# Patient Record
Sex: Male | Born: 1965 | Race: Asian | Hispanic: No | Marital: Married | Smoking: Never smoker
Health system: Southern US, Community
[De-identification: ages and names within clinical notes are randomized; demographics above are authoritative.]

## PROBLEM LIST (undated history)

## (undated) DIAGNOSIS — E785 Hyperlipidemia, unspecified: Secondary | ICD-10-CM

## (undated) DIAGNOSIS — I639 Cerebral infarction, unspecified: Secondary | ICD-10-CM

## (undated) HISTORY — PX: LOOP RECORDER IMPLANT: SHX5954

## (undated) HISTORY — PX: NO PAST SURGERIES: SHX2092

## (undated) HISTORY — DX: Cerebral infarction, unspecified: I63.9

---

## 2014-07-13 ENCOUNTER — Observation Stay (HOSPITAL_COMMUNITY): Payer: BC Managed Care – PPO

## 2014-07-13 ENCOUNTER — Inpatient Hospital Stay (HOSPITAL_COMMUNITY)
Admission: EM | Admit: 2014-07-13 | Discharge: 2014-07-15 | DRG: 066 | Disposition: A | Discharge status: Home / Self Care | Payer: BC Managed Care – PPO | Attending: Internal Medicine | Admitting: Internal Medicine

## 2014-07-13 ENCOUNTER — Encounter (HOSPITAL_COMMUNITY): Payer: Self-pay | Admitting: Neurology

## 2014-07-13 ENCOUNTER — Emergency Department (HOSPITAL_COMMUNITY): Payer: BC Managed Care – PPO

## 2014-07-13 DIAGNOSIS — R030 Elevated blood-pressure reading, without diagnosis of hypertension: Secondary | ICD-10-CM | POA: Diagnosis not present

## 2014-07-13 DIAGNOSIS — I63411 Cerebral infarction due to embolism of right middle cerebral artery: Secondary | ICD-10-CM | POA: Diagnosis not present

## 2014-07-13 DIAGNOSIS — E785 Hyperlipidemia, unspecified: Secondary | ICD-10-CM | POA: Diagnosis not present

## 2014-07-13 DIAGNOSIS — Z823 Family history of stroke: Secondary | ICD-10-CM

## 2014-07-13 DIAGNOSIS — G459 Transient cerebral ischemic attack, unspecified: Secondary | ICD-10-CM

## 2014-07-13 DIAGNOSIS — IMO0001 Reserved for inherently not codable concepts without codable children: Secondary | ICD-10-CM | POA: Diagnosis present

## 2014-07-13 DIAGNOSIS — R4781 Slurred speech: Secondary | ICD-10-CM | POA: Diagnosis present

## 2014-07-13 DIAGNOSIS — R531 Weakness: Secondary | ICD-10-CM | POA: Diagnosis present

## 2014-07-13 DIAGNOSIS — I639 Cerebral infarction, unspecified: Secondary | ICD-10-CM | POA: Diagnosis present

## 2014-07-13 DIAGNOSIS — R29810 Facial weakness: Secondary | ICD-10-CM | POA: Diagnosis present

## 2014-07-13 LAB — CBC
HCT: 46.9 % (ref 39.0–52.0)
Hemoglobin: 15.9 g/dL (ref 13.0–17.0)
MCH: 31.4 pg (ref 26.0–34.0)
MCHC: 33.9 g/dL (ref 30.0–36.0)
MCV: 92.5 fL (ref 78.0–100.0)
Platelets: 190 10*3/uL (ref 150–400)
RBC: 5.07 MIL/uL (ref 4.22–5.81)
RDW: 13.1 % (ref 11.5–15.5)
WBC: 6.3 10*3/uL (ref 4.0–10.5)

## 2014-07-13 LAB — APTT: aPTT: 32 seconds (ref 24–37)

## 2014-07-13 LAB — CBG MONITORING, ED: GLUCOSE-CAPILLARY: 133 mg/dL — AB (ref 65–99)

## 2014-07-13 LAB — COMPREHENSIVE METABOLIC PANEL
ALBUMIN: 3.9 g/dL (ref 3.5–5.0)
ALK PHOS: 74 U/L (ref 38–126)
ALT: 22 U/L (ref 17–63)
ANION GAP: 6 (ref 5–15)
AST: 20 U/L (ref 15–41)
BUN: 17 mg/dL (ref 6–20)
CALCIUM: 9.2 mg/dL (ref 8.9–10.3)
CO2: 28 mmol/L (ref 22–32)
CREATININE: 0.89 mg/dL (ref 0.61–1.24)
Chloride: 107 mmol/L (ref 101–111)
GFR calc non Af Amer: >60 mL/min (ref >60)
GLUCOSE: 141 mg/dL — AB (ref 65–99)
POTASSIUM: 4.1 mmol/L (ref 3.5–5.1)
SODIUM: 141 mmol/L (ref 135–145)
TOTAL PROTEIN: 6.3 g/dL — AB (ref 6.5–8.1)
Total Bilirubin: 0.7 mg/dL (ref 0.3–1.2)

## 2014-07-13 LAB — I-STAT CHEM 8, ED
BUN: 20 mg/dL (ref 6–20)
CHLORIDE: 103 mmol/L (ref 101–111)
CREATININE: 1 mg/dL (ref 0.61–1.24)
Calcium, Ion: 1.2 mmol/L (ref 1.12–1.23)
Glucose, Bld: 139 mg/dL — ABNORMAL HIGH (ref 65–99)
HCT: 48 % (ref 39.0–52.0)
Hemoglobin: 16.3 g/dL (ref 13.0–17.0)
Potassium: 4 mmol/L (ref 3.5–5.1)
SODIUM: 141 mmol/L (ref 135–145)
TCO2: 26 mmol/L (ref 0–100)

## 2014-07-13 LAB — RAPID URINE DRUG SCREEN, HOSP PERFORMED
Amphetamines: NOT DETECTED
Barbiturates: NOT DETECTED
Benzodiazepines: NOT DETECTED
COCAINE: NOT DETECTED
OPIATES: NOT DETECTED
TETRAHYDROCANNABINOL: NOT DETECTED

## 2014-07-13 LAB — DIFFERENTIAL
BASOS ABS: 0 10*3/uL (ref 0.0–0.1)
Basophils Relative: 0 % (ref 0–1)
Eosinophils Absolute: 0.1 10*3/uL (ref 0.0–0.7)
Eosinophils Relative: 1 % (ref 0–5)
Lymphocytes Relative: 24 % (ref 12–46)
Lymphs Abs: 1.5 10*3/uL (ref 0.7–4.0)
Monocytes Absolute: 0.4 10*3/uL (ref 0.1–1.0)
Monocytes Relative: 6 % (ref 3–12)
Neutro Abs: 4.3 10*3/uL (ref 1.7–7.7)
Neutrophils Relative %: 69 % (ref 43–77)

## 2014-07-13 LAB — PROTIME-INR
INR: 0.94 (ref 0.00–1.49)
Prothrombin Time: 12.8 seconds (ref 11.6–15.2)

## 2014-07-13 LAB — GLUCOSE, CAPILLARY: Glucose-Capillary: 95 mg/dL (ref 65–99)

## 2014-07-13 LAB — I-STAT TROPONIN, ED: Troponin i, poc: 0 ng/mL (ref 0.00–0.08)

## 2014-07-13 MED ORDER — ASPIRIN 81 MG PO CHEW
324.0000 mg | CHEWABLE_TABLET | Freq: Once | ORAL | Status: AC
  Administered 2014-07-13: 324 mg via ORAL
  Filled 2014-07-13: qty 4

## 2014-07-13 MED ORDER — ONDANSETRON HCL 4 MG/2ML IJ SOLN: 4.0000 mg | Freq: Three times a day (TID) | INTRAMUSCULAR | Status: AC | PRN

## 2014-07-13 MED ORDER — SODIUM CHLORIDE 0.9 % IV SOLN
INTRAVENOUS | Status: DC
  Administered 2014-07-13: 18:00:00 via INTRAVENOUS

## 2014-07-13 MED ORDER — ACETAMINOPHEN 325 MG PO TABS: 650.0000 mg | ORAL_TABLET | ORAL | Status: DC | PRN

## 2014-07-13 MED ORDER — STROKE: EARLY STAGES OF RECOVERY BOOK
Freq: Once | Status: AC
  Administered 2014-07-13: 1

## 2014-07-13 MED ORDER — ASPIRIN 325 MG PO TABS
325.0000 mg | ORAL_TABLET | Freq: Every day | ORAL | Status: DC
  Administered 2014-07-14 – 2014-07-15 (×2): 325 mg via ORAL
  Filled 2014-07-13 (×2): qty 1

## 2014-07-13 MED ORDER — ENOXAPARIN SODIUM 40 MG/0.4ML ~~LOC~~ SOLN
40.0000 mg | SUBCUTANEOUS | Status: DC
  Administered 2014-07-13 – 2014-07-14 (×2): 40 mg via SUBCUTANEOUS
  Filled 2014-07-13 (×2): qty 0.4

## 2014-07-13 NOTE — H&P (Signed)
History and Physical        Hospital Admission Note Date: 07/13/2014  Patient name: Ryan Mitchell Medical record number: [id number] Date of birth: Mar 18, 1965 Age: 49 y.o. Gender: male  PCP: Leanor RubensteinSUN,VYVYAN Y, MD  Referring physician: Wynetta EmeryNicole Pisciotta, PA     Chief Complaint:  Right-sided facial droop with slurred speech and right lower extremity weakness  HPI: Patient is a 49 year old male with hyperlipidemia not on any statins, presented to ED with evaluation for above symptoms. Patient reported that he was at work today and took a nap after lunch. Around 1:40 PM, he noticed that he had difficulty speaking and right leg weakness. He spoke to his colleague on the phone who also noted that he had slurred speech. His wife who works in the same building came to evaluate him and noted that he had right facial drooping. Patient also reported having headache, right lower extremity weakness. EMS was called and by the time EMS arrived, his symptoms had resolved, lasted about 20 minutes. Patient aborted his father had stroke at the age of 48.  Ed workup showed unremarkable CBC, BMET, CT head negative for acute CVA.  Review of Systems:  Constitutional: Denies fever, chills, diaphoresis, poor appetite and fatigue.  HEENT: Denies photophobia, eye pain, redness, hearing loss, ear pain, congestion, sore throat, rhinorrhea, sneezing, mouth sores, trouble swallowing, neck pain, neck stiffness and tinnitus.   Respiratory: Denies SOB, DOE, cough, chest tightness,  and wheezing.   Cardiovascular: Denies chest pain, palpitations and leg swelling.  Gastrointestinal: Denies nausea, vomiting, abdominal pain, diarrhea, constipation, blood in stool and abdominal distention.  Genitourinary: Denies dysuria, urgency, frequency, hematuria, flank pain and difficulty urinating.  Musculoskeletal: Denies myalgias, back pain,  joint swelling, arthralgias and gait problem.  Skin: Denies pallor, rash and wound.  Neurological: Please see history of present illness Hematological: Denies adenopathy. Easy bruising, personal or family bleeding history  Psychiatric/Behavioral: Denies suicidal ideation, mood changes, confusion, nervousness, sleep disturbance and agitation  Past Medical History: Hyperlipidemia  History reviewed. No pertinent past surgical history.  Medications: Prior to Admission medications   Not on File    Allergies:  No Known Allergies  Social History:  reports that he has never smoked. He does not have any smokeless tobacco history on file. He reports that he drinks alcohol 1 beer or glass of wine at night. Denies any drug use, lives at home with his wife  Family History: Patient reports that his father had a stroke at the age of 48  Physical Exam: Blood pressure 132/86, pulse 59, temperature 98.2 F (36.8 C), temperature source Oral, resp. rate 16, height 5' (1.524 m), weight 53.978 kg (119 lb), SpO2 99 %. General: Alert, awake, oriented x3, in no acute distress, no dysarthria or facial drooping. HEENT: normocephalic, atraumatic, anicteric sclera, pink conjunctiva, pupils equal and reactive to light and accomodation, oropharynx clear Neck: supple, no masses or lymphadenopathy, no goiter, no bruits  Heart: Regular rate and rhythm, without murmurs, rubs or gallops. Lungs: Clear to auscultation bilaterally, no wheezing, rales or rhonchi. Abdomen: Soft, nontender, nondistended, positive bowel sounds, no masses. Extremities: No clubbing, cyanosis or edema with positive pedal pulses. Neuro: Grossly intact, no  focal neurological deficits, strength 5/5 upper and lower extremities bilaterally Psych: alert and oriented x 3, normal mood and affect Skin: no rashes or lesions, warm and dry   LABS on Admission:  Basic Metabolic Panel:  Recent Labs Lab 07/13/14 1454 07/13/14 1516  NA 141 141  K  4.1 4.0  CL 107 103  CO2 28  --   GLUCOSE 141* 139*  BUN 17 20  CREATININE 0.89 1.00  CALCIUM 9.2  --    Liver Function Tests:  Recent Labs Lab 07/13/14 1454  AST 20  ALT 22  ALKPHOS 74  BILITOT 0.7  PROT 6.3*  ALBUMIN 3.9   No results for input(s): LIPASE, AMYLASE in the last 168 hours. No results for input(s): AMMONIA in the last 168 hours. CBC:  Recent Labs Lab 07/13/14 1454 07/13/14 1516  WBC 6.3  --   NEUTROABS 4.3  --   HGB 15.9 16.3  HCT 46.9 48.0  MCV 92.5  --   PLT 190  --    Cardiac Enzymes: No results for input(s): CKTOTAL, CKMB, CKMBINDEX, TROPONINI in the last 168 hours. BNP: Invalid input(s): POCBNP CBG:  Recent Labs Lab 07/13/14 1527  GLUCAP 133*    Radiological Exams on Admission:  Ct Head (brain) Wo Contrast  07/13/2014   CLINICAL DATA:  Right-sided facial droop and slurred speech, transient  EXAM: CT HEAD WITHOUT CONTRAST  TECHNIQUE: Contiguous axial images were obtained from the base of the skull through the vertex without intravenous contrast.  COMPARISON:  None.  FINDINGS: The ventricles are normal in size and configuration. There is no intracranial mass, hemorrhage, extra-axial fluid collection, or midline shift. Gray-white compartments are normal. There is no demonstrable acute infarct. The bony calvarium appears intact. The mastoid air cells are clear. There is rightward deviation of the nasal septum.  IMPRESSION: No intracranial mass, hemorrhage, or focal gray - white compartment lesions/acute appearing infarct. There is rightward deviation of the nasal septum.   Electronically Signed   By: Bretta Bang III M.D.   On: 07/13/2014 15:35    *I have personally reviewed the images above*  EKG: Independently reviewed. Rate 64, normal sinus rhythm, no acute ST-T wave changes just above ischemia   Assessment/Plan Principal Problem:   TIA (transient ischemic attack): Patient had symptoms of right facial drooping, slurred speech, right  lower exhibiting weakness which has resolved - Admit to telemetry - place on neuro checks q4 hr - Head CT on admission negative for acute event. Ordered MRI brain, MRA head. - obtain 2D echo, carotid doppler, A1C and lipid panel - swallow eval, PT, OT. - ASA 325 mg/300mg  PR daily. Allow permissive HTN  Active Problems:   Hyperlipidemia - Obtain lipid panel, patient may need to be on statin    Elevated BP - Allow permissive hypertension, BP 123/102. Patient is not on any antihypertensives outpatient.  DVT prophylaxis: Lovenox  CODE STATUS: full code  Family Communication: Admission, patients condition and plan of care including tests being ordered have been discussed with the patient and wife who indicates understanding and agree with the plan and Code Status  Disposition plan: Further plan will depend as patient's clinical course evolves and further radiologic and laboratory data become available.   Time Spent on Admission: 1 hour  RAI,RIPUDEEP M.D. [facility] 07/13/2014, 4:33 PM Pager: [phone]  If 7PM-7AM, please contact night-coverage [email or web address] Password [email or web address]

## 2014-07-13 NOTE — ED Notes (Signed)
Pt reports today at 1330 after lunch was trying to take a nap, coworker noticed right sided facial droop, speech was slurred. EMS called and at that time they arrived, symptoms were gone. intial BP 182/122, after 5 mins 150/112, CBG 130. At current denies pain, is a x 4, no s/s.

## 2014-07-13 NOTE — ED Provider Notes (Signed)
CSN: [id number]     Arrival date & time 07/13/14  1437 History   None    Chief Complaint  Patient presents with  . Transient Ischemic Attack     (Consider location/radiation/quality/duration/timing/severity/associated sxs/prior Treatment) HPI  Blood pressure 155/86, pulse 64, temperature 97.4 F (36.3 C), temperature source Oral, resp. rate 18, height 5' (1.524 m), weight 119 lb (53.978 kg), SpO2 98 %.  Ryan Mitchell is a 49 y.o. male .in for evaluation of right-sided facial droop, slurred speech and right lower extremity weakness onset this afternoon at approximately 2 PM. Patient states that he went to lunch, he felt fatigued her words and tried to take a nap by pushing 2 chairs together, he states that he lay down on the right side of his face. He states that he couldn't sleep and he woke up, he spoke to his supervisor on the phone who states that he had slurred speech, he called his wife who came to evaluate him in she noted that he had a right sided facial droop. He reports that he had a resolved headache and date right lower extremity pain and weakness but he could walk and there was no falling. By the time EMS was called all of the symptoms have resolved. States that the symptoms lasted approximately 5-10 minutes. He denies any history of tobacco use, diabetes, hypertension. He does have a recently diagnosed hyperlipidemia but was not started on any medications. He saw his primary care physician in approximately 2 months ago and checks and regularly. Father stroke at 3870   History reviewed. No pertinent past medical history. History reviewed. No pertinent past surgical history. No family history on file. History  Substance Use Topics  . Smoking status: Never Smoker   . Smokeless tobacco: Not on file  . Alcohol Use: Yes    Review of Systems  10 systems reviewed and found to be negative, except as noted in the HPI.  Allergies  Review of patient's allergies indicates no known  allergies.  Home Medications   Prior to Admission medications   Not on File   BP 155/86 mmHg  Pulse 64  Temp(Src) 97.4 F (36.3 C) (Oral)  Resp 18  Ht 5' (1.524 m)  Wt 119 lb (53.978 kg)  BMI 23.24 kg/m2  SpO2 98% Physical Exam  Constitutional: He is oriented to person, place, and time. He appears well-developed and well-nourished.  HENT:  Head: Normocephalic and atraumatic.  Mouth/Throat: Oropharynx is clear and moist.  Eyes: Conjunctivae and EOM are normal. Pupils are equal, round, and reactive to light.  No TTP of maxillary or frontal sinuses  No TTP or induration of temporal arteries bilaterally  Neck: Normal range of motion. Neck supple.  FROM to C-spine. Pt can touch chin to chest without discomfort. No TTP of midline cervical spine.   Cardiovascular: Normal rate, regular rhythm and intact distal pulses.   Pulmonary/Chest: Effort normal and breath sounds normal. No respiratory distress. He has no wheezes. He has no rales. He exhibits no tenderness.  Abdominal: Soft. Bowel sounds are normal. There is no tenderness.  Musculoskeletal: Normal range of motion. He exhibits no edema or tenderness.  Neurological: He is alert and oriented to person, place, and time. No cranial nerve deficit.  II-Visual fields grossly intact. III/IV/VI-Extraocular movements intact.  Pupils reactive bilaterally. V/VII-Smile symmetric, equal eyebrow raise,  facial sensation intact VIII- Hearing grossly intact IX/X-Normal gag XI-bilateral shoulder shrug XII-midline tongue extension Motor: 5/5 bilaterally with normal tone and bulk Cerebellar: Normal  finger-to-nose  and normal heel-to-shin test.   Romberg negative Ambulates with a coordinated gait   Nursing note and vitals reviewed.   ED Course  Procedures (including critical care time) Labs Review Labs Reviewed  COMPREHENSIVE METABOLIC PANEL - Abnormal; Notable for the following:    Glucose, Bld 141 (*)    Total Protein 6.3 (*)    All  other components within normal limits  I-STAT CHEM 8, ED - Abnormal; Notable for the following:    Glucose, Bld 139 (*)    All other components within normal limits  CBG MONITORING, ED - Abnormal; Notable for the following:    Glucose-Capillary 133 (*)    All other components within normal limits  PROTIME-INR  APTT  CBC  DIFFERENTIAL  I-STAT TROPOININ, ED    Imaging Review Ct Head (brain) Wo Contrast  07/13/2014   CLINICAL DATA:  Right-sided facial droop and slurred speech, transient  EXAM: CT HEAD WITHOUT CONTRAST  TECHNIQUE: Contiguous axial images were obtained from the base of the skull through the vertex without intravenous contrast.  COMPARISON:  None.  FINDINGS: The ventricles are normal in size and configuration. There is no intracranial mass, hemorrhage, extra-axial fluid collection, or midline shift. Gray-white compartments are normal. There is no demonstrable acute infarct. The bony calvarium appears intact. The mastoid air cells are clear. There is rightward deviation of the nasal septum.  IMPRESSION: No intracranial mass, hemorrhage, or focal gray - white compartment lesions/acute appearing infarct. There is rightward deviation of the nasal septum.   Electronically Signed   By: Bretta Bang III M.D.   On: 07/13/2014 15:35     EKG Interpretation None      MDM   Final diagnoses:  Transient cerebral ischemia, unspecified transient cerebral ischemia type   Filed Vitals:   07/13/14 1447 07/13/14 1523 07/13/14 1533  BP: 155/86 137/89   Pulse: 64 65   Temp: 97.4 F (36.3 C)  98.2 F (36.8 C)  TempSrc: Oral    Resp: 18 18   Height: 5' (1.524 m)    Weight: 119 lb (53.978 kg)    SpO2: 98% 97%     Medications  aspirin chewable tablet 324 mg (not administered)    Ryan Mitchell is a pleasant 49 y.o. male presenting with slurred speech, right-sided facial droop and right lower extremity pain and weakness with no falling or ataxia. The symptoms lasted for 5-10 minutes and  resolved spontaneously. Father had a stroke in his 65s and patient has recently been diagnosed with hyperlipidemia otherwise no vascular risk factors. CT head and blood work are negative. EKG normal sinus rhythm. Patient is started on full dose aspirin. Case discussed with [facility] Dr. Isidoro Donning who accepts admission.    Wynetta Emery, PA-C 07/13/14 1628  Blake Divine, MD 07/13/14 [phone]

## 2014-07-14 ENCOUNTER — Observation Stay (HOSPITAL_COMMUNITY): Payer: BC Managed Care – PPO

## 2014-07-14 DIAGNOSIS — I63411 Cerebral infarction due to embolism of right middle cerebral artery: Secondary | ICD-10-CM | POA: Insufficient documentation

## 2014-07-14 DIAGNOSIS — R29810 Facial weakness: Secondary | ICD-10-CM | POA: Diagnosis present

## 2014-07-14 DIAGNOSIS — G459 Transient cerebral ischemic attack, unspecified: Secondary | ICD-10-CM | POA: Diagnosis not present

## 2014-07-14 DIAGNOSIS — Z823 Family history of stroke: Secondary | ICD-10-CM | POA: Diagnosis not present

## 2014-07-14 DIAGNOSIS — I639 Cerebral infarction, unspecified: Secondary | ICD-10-CM | POA: Diagnosis present

## 2014-07-14 DIAGNOSIS — E785 Hyperlipidemia, unspecified: Secondary | ICD-10-CM | POA: Diagnosis present

## 2014-07-14 DIAGNOSIS — R531 Weakness: Secondary | ICD-10-CM | POA: Diagnosis present

## 2014-07-14 DIAGNOSIS — R030 Elevated blood-pressure reading, without diagnosis of hypertension: Secondary | ICD-10-CM | POA: Diagnosis present

## 2014-07-14 DIAGNOSIS — R55 Syncope and collapse: Secondary | ICD-10-CM | POA: Diagnosis not present

## 2014-07-14 DIAGNOSIS — R4781 Slurred speech: Secondary | ICD-10-CM | POA: Diagnosis present

## 2014-07-14 LAB — LIPID PANEL
Cholesterol: 217 mg/dL — ABNORMAL HIGH (ref 0–200)
HDL: 54 mg/dL (ref >40)
LDL Cholesterol: 97 mg/dL (ref 0–99)
Total CHOL/HDL Ratio: 4 RATIO
Triglycerides: 331 mg/dL — ABNORMAL HIGH (ref <150)
VLDL: 66 mg/dL — ABNORMAL HIGH (ref 0–40)

## 2014-07-14 LAB — GLUCOSE, CAPILLARY
GLUCOSE-CAPILLARY: 96 mg/dL (ref 65–99)
Glucose-Capillary: 97 mg/dL (ref 65–99)
Glucose-Capillary: 101 mg/dL — ABNORMAL HIGH (ref 65–99)
Glucose-Capillary: 119 mg/dL — ABNORMAL HIGH (ref 65–99)

## 2014-07-14 LAB — HEMOGLOBIN A1C
Hgb A1c MFr Bld: 5.9 % — ABNORMAL HIGH (ref 4.8–5.6)
MEAN PLASMA GLUCOSE: 123 mg/dL

## 2014-07-14 LAB — ANTITHROMBIN III: AntiThromb III Func: 100 % (ref 75–120)

## 2014-07-14 MED ORDER — ATORVASTATIN CALCIUM 10 MG PO TABS
20.0000 mg | ORAL_TABLET | Freq: Every day | ORAL | Status: DC
  Administered 2014-07-14: 20 mg via ORAL
  Filled 2014-07-14: qty 2

## 2014-07-14 NOTE — Progress Notes (Signed)
*  PRELIMINARY RESULTS* Vascular Ultrasound Carotid Duplex (Doppler) has been completed.  Findings suggest 1-39% internal carotid artery stenosis bilaterally. Vertebral arteries are patent with antegrade flow.  07/14/2014 12:12 PM Gertie FeyMichelle Grant Henkes, RVT, RDCS, RDMS

## 2014-07-14 NOTE — Progress Notes (Signed)
Echocardiogram 2D Echocardiogram has been performed.  Dorothey BasemanReel, Emiliana Blaize M 07/14/2014, 10:22 AM

## 2014-07-14 NOTE — Progress Notes (Signed)
[facility] PROGRESS NOTE  Hakim Minniefield FAO:[id number] DOB: 19-Dec-1965 DOA: 07/13/2014  PCP: Leanor Rubenstein, MD  Brief HPI: 49 year old male with a history of hyperlipidemia, although not on treatment, and no other medical problems presented with complains of right-sided facial droop with slurred speech and perhaps right-sided weakness, although he was not certain about this. He was admitted for further management.  Consultants: Neurology  Procedures:   2-D echocardiogram is pending  Carotid Doppler is pending  Antibiotics: None  Subjective: Patient feels better this morning. His symptoms have all resolved. He feels his speech is back to normal. He feels that the strength in the right side is also normal. Denies any chest pain or shortness of breath.  Objective: Vital Signs  Filed Vitals:   07/13/14 2130 07/14/14 0030 07/14/14 0300 07/14/14 0500  BP: 133/79 108/78 118/86 129/79  Pulse: 68 71 59 54  Temp: 98.9 F (37.2 C) 98 F (36.7 C) 98.3 F (36.8 C) 98.4 F (36.9 C)  TempSrc: Oral Oral Oral Oral  Resp: Height:      Weight:      SpO2: 98% 97% 98% 97%    Intake/Output Summary (Last 24 hours) at 07/14/14 0834 Last data filed at 07/13/14 2015  Gross per 24 hour  Intake      0 ml  Output    250 ml  Net   -250 ml   Filed Weights   07/13/14 1447  Weight: 53.978 kg (119 lb)    General appearance: alert, cooperative and no distress Resp: clear to auscultation bilaterally Cardio: regular rate and rhythm, S1, S2 normal, no murmur, click, rub or gallop GI: soft, non-tender; bowel sounds normal; no masses,  no organomegaly Extremities: extremities normal, atraumatic, no cyanosis or edema Neurologic: Alert and oriented 3. No facial asymmetry. Tongue is midline. Strength equal bilateral upper and lower extremities and normal.  Lab Results:  Basic Metabolic Panel:  Recent Labs Lab 07/13/14 1454 07/13/14 1516  NA 141 141  K 4.1 4.0  CL 107 103    CO2 28  --   GLUCOSE 141* 139*  BUN 17 20  CREATININE 0.89 1.00  CALCIUM 9.2  --    Liver Function Tests:  Recent Labs Lab 07/13/14 1454  AST 20  ALT 22  ALKPHOS 74  BILITOT 0.7  PROT 6.3*  ALBUMIN 3.9   CBC:  Recent Labs Lab 07/13/14 1454 07/13/14 1516  WBC 6.3  --   NEUTROABS 4.3  --   HGB 15.9 16.3  HCT 46.9 48.0  MCV 92.5  --   PLT 190  --    CBG:  Recent Labs Lab 07/13/14 1527 07/13/14 2111 07/14/14 0642  GLUCAP 133* 95 96    Studies/Results: Ct Head (brain) Wo Contrast  07/13/2014   CLINICAL DATA:  Right-sided facial droop and slurred speech, transient  EXAM: CT HEAD WITHOUT CONTRAST  TECHNIQUE: Contiguous axial images were obtained from the base of the skull through the vertex without intravenous contrast.  COMPARISON:  None.  FINDINGS: The ventricles are normal in size and configuration. There is no intracranial mass, hemorrhage, extra-axial fluid collection, or midline shift. Gray-white compartments are normal. There is no demonstrable acute infarct. The bony calvarium appears intact. The mastoid air cells are clear. There is rightward deviation of the nasal septum.  IMPRESSION: No intracranial mass, hemorrhage, or focal gray - white compartment lesions/acute appearing infarct. There is rightward deviation of the nasal septum.   Electronically Signed  By: Bretta Bang III M.D.   On: 07/13/2014 15:35   Mri Brain Without Contrast  07/13/2014   CLINICAL DATA:  Transient ischemic attack. Right facial droop, slurred speech in right lower extremity weakness which began approximately 6 hr ago. Speech disturbance as well.  EXAM: MRI HEAD WITHOUT CONTRAST  MRA HEAD WITHOUT CONTRAST  TECHNIQUE: Multiplanar, multiecho pulse sequences of the brain and surrounding structures were obtained without intravenous contrast. Angiographic images of the head were obtained using MRA technique without contrast.  COMPARISON:  Head CT same day  FINDINGS: MRI HEAD FINDINGS  There is  a 1 cm acute infarction affecting the right frontal cortex near the vertex. No other acute infarction.  The brainstem and cerebellum are normal. There are mild chronic small-vessel ischemic changes affecting the cerebral hemispheric white matter. No large vessel territory infarction. No mass lesion, hemorrhage, hydrocephalus or extra-axial collection. No pituitary mass. No significant sinus disease.  MRA HEAD FINDINGS  Both internal carotid arteries are widely patent through the siphon regions. The anterior and middle cerebral vessels on the left are normal. No stenosis or occlusion. On the right, the anterior cerebral artery is normal. 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 More distal vessels do show flow.  Both vertebral arteries are widely patent to the basilar. No basilar stenosis. Posterior circulation branch vessels are normal. Right PCA takes a fetal origin from the anterior circulation.  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  Mild chronic small-vessel change elsewhere within the cerebral hemispheric white matter.  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 More distal vessels do largely show reconstituted flow.   Electronically Signed   By: Paulina Fusi M.D.   On: 07/13/2014 19:11   Mr Maxine Glenn Head/brain Wo Cm  07/13/2014   CLINICAL DATA:  Transient ischemic attack. Right facial droop, slurred speech in right lower extremity weakness which began approximately 6 hr ago. Speech disturbance as well.  EXAM: MRI HEAD WITHOUT CONTRAST  MRA HEAD WITHOUT CONTRAST  TECHNIQUE: Multiplanar, multiecho pulse sequences of the brain and surrounding structures were obtained without intravenous contrast. Angiographic images of the head were obtained using MRA technique without contrast.  COMPARISON:  Head CT same day  FINDINGS: MRI HEAD FINDINGS  There is a 1 cm acute  infarction affecting the right frontal cortex near the vertex. No other acute infarction.  The brainstem and cerebellum are normal. There are mild chronic small-vessel ischemic changes affecting the cerebral hemispheric white matter. No large vessel territory infarction. No mass lesion, hemorrhage, hydrocephalus or extra-axial collection. No pituitary mass. No significant sinus disease.  MRA HEAD FINDINGS  Both internal carotid arteries are widely patent through the siphon regions. The anterior and middle cerebral vessels on the left are normal. No stenosis or occlusion. On the right, the anterior cerebral artery is normal. 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 More distal vessels do show flow.  Both vertebral arteries are widely patent to the basilar. No basilar stenosis. Posterior circulation branch vessels are normal. Right PCA takes a fetal origin from the anterior circulation.  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  Mild chronic small-vessel change elsewhere within the cerebral hemispheric white matter.  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 More distal vessels do largely show reconstituted flow.   Electronically Signed   By: Paulina FusiMark  Shogry M.D.   On: 07/13/2014 19:11    Medications:  Scheduled: . aspirin  325 mg Oral Daily  . atorvastatin  20 mg Oral q1800  . enoxaparin (LOVENOX) injection  40 mg Subcutaneous Q24H   Continuous: . sodium chloride 75 mL/hr at 07/13/14 1759   OZH:[id number]:acetaminophen  Assessment/Plan:  Principal Problem:   TIA (transient ischemic attack) Active Problems:   Hyperlipidemia   Elevated BP    Acute stroke involving right hemisphere Symptoms did not correspond with findings on MRI. He does not have any deficits currently. 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 We'll consult neurology to manage the stroke.  Stroke workup in progress with echocardiogram and carotid Dopplers pending. Initiate statin. LDL is 97. HbA1c is pending. Continue aspirin for now.  Hyperlipidemia Initiate statin as discussed above.  Elevated blood pressure at the time of admission No known history of hypertension. Blood pressures are better this morning and not in the hypertensive range. Unlikely he will need any medications.   DVT Prophylaxis: Lovenox    Code Status: Full code  Family Communication: Discussed in detail with the patient  Disposition Plan: Await stroke workup to be completed and further plans per neurology.  Follow-up Appointment?: Will need to follow-up with neurology as well as his primary care physician   LOS: 1 day   Fairmont HospitalKRISHNAN,Mariangel Ringley  [facility] Pager [phone] 07/14/2014, 8:34 AM  If 7PM-7AM, please contact night-coverage at [email or web address], password [email or web address]

## 2014-07-14 NOTE — Consult Note (Signed)
Stroke Consult    Chief Complaint:  HPI: Ryan Mitchell is an 49 y.o. male hx of HLD presenting for evaluation of transient episode of right facial droop, right LE weakness and slurred speech. LSW 1340 on 7/01 when he noted difficulty speaking and RLE weakness. Wife noted a right sided facial droop. Symptoms lasted around 20 minutes and then resolved. Asymptomatic upon arrival to ED.   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 Father had stroke at 63. He denies any clotting history. No history of A fib or heart palpitations.   Date last known well: 07/13/2014 Time last known well: 1340 tPA Given:no, outside tPA window at time of consult, asymptomatic upon arrival to ED  History reviewed. No pertinent past medical history.  History reviewed. No pertinent past surgical history.  No family history on file. Social History:  reports that he has never smoked. He does not have any smokeless tobacco history on file. He reports that he drinks alcohol. His drug history is not on file.  Allergies: No Known Allergies  No prescriptions prior to admission    ROS: Out of a complete 14 system review, the patient complains of only the following symptoms, and all other reviewed systems are negative. +weakness  Physical Examination: Filed Vitals:   07/14/14 0500  BP: 129/79  Pulse: 54  Temp: 98.4 F (36.9 C)  Resp: 18   Physical Exam  Constitutional: He appears well-developed and well-nourished.  Psych: Affect appropriate to situation Eyes: No scleral injection HENT: No OP obstrucion Head: Normocephalic.  Cardiovascular: Normal rate and regular rhythm.  Respiratory: Effort normal and breath sounds normal.  GI: Soft. Bowel sounds are normal. No distension. There is no tenderness.  Skin: WDI   Neurologic Examination: Mental Status: Alert, oriented, thought content appropriate.  Speech fluent without evidence  of aphasia.  Able to follow 3 step commands without difficulty. Cranial Nerves: II: funduscopic exam wnl bilaterally, visual fields grossly normal, pupils equal, round, reactive to light and accommodation III,IV, VI: ptosis not present, extra-ocular motions intact bilaterally V,VII: smile symmetric, facial light touch sensation normal bilaterally VIII: hearing normal bilaterally IX,X: gag reflex present XI: trapezius strength/neck flexion strength normal bilaterally XII: tongue strength normal  Motor: Right : Upper extremity    Left:     Upper extremity 5/5 deltoid       5/5 deltoid 5/5 biceps      5/5 biceps  5/5 triceps      5/5 triceps 5/5wrist flexion     5/5 wrist flexion 5/5 wrist extension     5/5 wrist extension 5/5 hand grip      5/5 hand grip  Lower extremity     Lower extremity 5/5 hip flexor      5/5 hip flexor 5/5 hip adductors     5/5 hip adductors 5/5 hip abductors     5/5 hip abductors 5/5 quadricep      5/5 quadriceps  5/5 hamstrings     5/5 hamstrings 5/5 plantar flexion       5/5 plantar flexion 5/5 plantar extension     5/5 plantar extension Tone and bulk:normal tone throughout; no atrophy noted Sensory: Pinprick and light touch intact throughout, bilaterally Deep Tendon Reflexes: 2+ and symmetric throughout Plantars: Right: downgoing   Left: downgoing Cerebellar: normal finger-to-nose, normal rapid alternating movements and normal heel-to-shin test Gait: normal gait and station  Laboratory Studies:   Basic Metabolic Panel:  Recent Labs Lab 07/13/14 1454 07/13/14 1516  NA 141 141  K 4.1 4.0  CL 107 103  CO2 28  --   GLUCOSE 141* 139*  BUN 17 20  CREATININE 0.89 1.00  CALCIUM 9.2  --     Liver Function Tests:  Recent Labs Lab 07/13/14 1454  AST 20  ALT 22  ALKPHOS 74  BILITOT 0.7  PROT 6.3*  ALBUMIN 3.9   No results for input(s): LIPASE, AMYLASE in the last 168 hours. No results for input(s): AMMONIA in the last 168  hours.  CBC:  Recent Labs Lab 07/13/14 1454 07/13/14 1516  WBC 6.3  --   NEUTROABS 4.3  --   HGB 15.9 16.3  HCT 46.9 48.0  MCV 92.5  --   PLT 190  --     Cardiac Enzymes: No results for input(s): CKTOTAL, CKMB, CKMBINDEX, TROPONINI in the last 168 hours.  BNP: Invalid input(s): POCBNP  CBG:  Recent Labs Lab 07/13/14 1527 07/13/14 2111 07/14/14 0642  GLUCAP 133* 95 96    Microbiology: No results found for this or any previous visit.  Coagulation Studies:  Recent Labs  07/13/14 1454  LABPROT 12.8  INR 0.94    Urinalysis: No results for input(s): COLORURINE, LABSPEC, PHURINE, GLUCOSEU, HGBUR, BILIRUBINUR, KETONESUR, PROTEINUR, UROBILINOGEN, NITRITE, LEUKOCYTESUR in the last 168 hours.  Invalid input(s): APPERANCEUR  Lipid Panel:     Component Value Date/Time   CHOL 217* 07/14/2014 0503   TRIG 331* 07/14/2014 0503   HDL 54 07/14/2014 0503   CHOLHDL 4.0 07/14/2014 0503   VLDL 66* 07/14/2014 0503   LDLCALC 97 07/14/2014 0503    HgbA1C:  Lab Results  Component Value Date   HGBA1C 5.9* 07/13/2014    Urine Drug Screen:     Component Value Date/Time   LABOPIA NONE DETECTED 07/13/2014 2015   COCAINSCRNUR NONE DETECTED 07/13/2014 2015   LABBENZ NONE DETECTED 07/13/2014 2015   AMPHETMU NONE DETECTED 07/13/2014 2015   THCU NONE DETECTED 07/13/2014 2015   LABBARB NONE DETECTED 07/13/2014 2015    Alcohol Level: No results for input(s): ETH in the last 168 hours.  Other results:  Imaging: Ct Head (brain) Wo Contrast  07/13/2014   CLINICAL DATA:  Right-sided facial droop and slurred speech, transient  EXAM: CT HEAD WITHOUT CONTRAST  TECHNIQUE: Contiguous axial images were obtained from the base of the skull through the vertex without intravenous contrast.  COMPARISON:  None.  FINDINGS: The ventricles are normal in size and configuration. There is no intracranial mass, hemorrhage, extra-axial fluid collection, or midline shift. Gray-white compartments are  normal. There is no demonstrable acute infarct. The bony calvarium appears intact. The mastoid air cells are clear. There is rightward deviation of the nasal septum.  IMPRESSION: No intracranial mass, hemorrhage, or focal gray - white compartment lesions/acute appearing infarct. There is rightward deviation of the nasal septum.   Electronically Signed   By: Bretta Bang III M.D.   On: 07/13/2014 15:35   Mri Brain Without Contrast  07/13/2014   CLINICAL DATA:  Transient ischemic attack. Right facial droop, slurred speech in right lower extremity weakness which began approximately 6 hr ago. Speech disturbance as well.  EXAM: MRI HEAD WITHOUT CONTRAST  MRA HEAD WITHOUT CONTRAST  TECHNIQUE: Multiplanar, multiecho pulse sequences of the brain and surrounding structures were obtained without intravenous contrast. Angiographic images of the head were obtained using MRA technique without contrast.  COMPARISON:  Head CT same day  FINDINGS: MRI HEAD FINDINGS  There is a 1 cm acute infarction affecting the right frontal cortex near the vertex. No other acute infarction.  The brainstem and cerebellum are normal. There are mild chronic small-vessel ischemic changes affecting the cerebral hemispheric white matter. No large vessel territory infarction. No mass lesion, hemorrhage, hydrocephalus or extra-axial collection. No pituitary mass. No significant sinus disease.  MRA HEAD FINDINGS  Both internal carotid arteries are widely patent through the siphon regions. The anterior and middle cerebral vessels on the left are normal. No stenosis or occlusion. On the right, the anterior cerebral artery is normal. 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 More distal vessels do show flow.  Both vertebral arteries are widely patent to the basilar. No basilar stenosis. Posterior circulation branch vessels are normal. Right PCA takes a fetal origin from the anterior  circulation.  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  Mild chronic small-vessel change elsewhere within the cerebral hemispheric white matter.  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 More distal vessels do largely show reconstituted flow.   Electronically Signed   By: Paulina Fusi M.D.   On: 07/13/2014 19:11   Mr Maxine Glenn Head/brain Wo Cm  07/13/2014   CLINICAL DATA:  Transient ischemic attack. Right facial droop, slurred speech in right lower extremity weakness which began approximately 6 hr ago. Speech disturbance as well.  EXAM: MRI HEAD WITHOUT CONTRAST  MRA HEAD WITHOUT CONTRAST  TECHNIQUE: Multiplanar, multiecho pulse sequences of the brain and surrounding structures were obtained without intravenous contrast. Angiographic images of the head were obtained using MRA technique without contrast.  COMPARISON:  Head CT same day  FINDINGS: MRI HEAD FINDINGS  There is a 1 cm acute infarction affecting the right frontal cortex near the vertex. No other acute infarction.  The brainstem and cerebellum are normal. There are mild chronic small-vessel ischemic changes affecting the cerebral hemispheric white matter. No large vessel territory infarction. No mass lesion, hemorrhage, hydrocephalus or extra-axial collection. No pituitary mass. No significant sinus disease.  MRA HEAD FINDINGS  Both internal carotid arteries are widely patent through the siphon regions. The anterior and middle cerebral vessels on the left are normal. No stenosis or occlusion. On the right, the anterior cerebral artery is normal. 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 More distal vessels do show flow.  Both vertebral arteries are widely patent to the basilar. No basilar stenosis. Posterior circulation branch vessels are normal. Right PCA takes a fetal origin from the anterior circulation.   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  Mild chronic small-vessel change elsewhere within the cerebral hemispheric white matter.  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 More distal vessels do largely show reconstituted flow.   Electronically Signed   By: Paulina Fusi M.D.   On: 07/13/2014 19:11    Assessment: 49 y.o. male with history of HLD presenting to the ED with transient episode of reported right facial droop, RLE weakness and slurred speech lasting around 20 minutes. MRI brain shows an acute infarct in the right frontal cortex and severe right MCA stenosis. Reported stroke symptoms do not correlate with stroke location.   Stroke Risk Factors - Hyperlipidemia  Plan:  1. HgbA1c, fasting lipid panel 2. MRI, MRA  of the brain without contrast completed 3. PT consult, OT consult, Speech consult 4. Echocardiogram. May consider TEE and loop recorder 5. Carotid dopplers 6. Prophylactic therapy-aspirin 325mg   7. Risk factor modification 8. Telemetry monitoring 9. Frequent neuro checks 10. NPO until RN stroke swallow screen    Elspeth Choeter Geryl Dohn, DO [facility] [phone]  If 7pm- 7am, please page neurology on call as listed in AMION. 07/14/2014, 9:04 AM

## 2014-07-14 NOTE — Progress Notes (Signed)
OT Cancellation Note  Patient Details Name: Ryan RochSanjun Azpeitia MRN: [id number] DOB: 1965-12-28   Cancelled Treatment:    Reason Eval/Treat Not Completed: OT screened, no needs identified, will sign off. PT with patient, walking in hallway. Pt reports that everything "back to normal" with no change in vision (no blurring or double vision). Please send text page to OT services if any questions, concerns, or with new orders: [phone] OR call office at [phone]. Thank you for the order.    Lashanta Elbe , MS, OTR/L, CLT Pager: [phone]  07/14/2014, 8:37 AM

## 2014-07-14 NOTE — Evaluation (Signed)
Physical Therapy Evaluation and Discharge Patient Details Name: Ryan Mitchell MRN: [id number] DOB: 17-Apr-1965 Today's Date: 07/14/2014   History of Present Illness  Patient is a 49 year old male with hyperlipidemia not on any statins, presented to ED with evaluation for above symptoms. Patient reported that he was at work today and took a nap after lunch. Around 1:40 PM, he noticed that he had difficulty speaking and right leg weakness.   Clinical Impression  Pt functioning at baseline with no motor deficits. Pt at minimal falls risk as indicated by score of 24/24 on DGI. Pt with no further acute PT needs at this time. PT SIGNING OFF. Please re-consult if needed in future.    Follow Up Recommendations No PT follow up    Equipment Recommendations  None recommended by PT    Recommendations for Other Services       Precautions / Restrictions Precautions Precautions: None Restrictions Weight Bearing Restrictions: No      Mobility  Bed Mobility Overal bed mobility: Independent                Transfers Overall transfer level: Independent Equipment used: None             General transfer comment: pt safe with no instability  Ambulation/Gait Ambulation/Gait assistance: Independent Ambulation Distance (Feet): 500 Feet Assistive device: None Gait Pattern/deviations: WFL(Within Functional Limits)   Gait velocity interpretation: <1.8 ft/sec, indicative of risk for recurrent falls General Gait Details: no episodes of LOB  Stairs Stairs: Yes Stairs assistance: Independent Stair Management: No rails Number of Stairs: 6 General stair comments: limited by IV pole  Wheelchair Mobility    Modified Rankin (Stroke Patients Only) Modified Rankin (Stroke Patients Only) Pre-Morbid Rankin Score: No symptoms Modified Rankin: No symptoms     Balance Overall balance assessment: Independent                               Standardized Balance  Assessment Standardized Balance Assessment : Dynamic Gait Index   Dynamic Gait Index Level Surface: Normal Change in Gait Speed: Normal Gait with Horizontal Head Turns: Normal Gait with Vertical Head Turns: Normal Gait and Pivot Turn: Normal Step Over Obstacle: Normal Step Around Obstacles: Normal Steps: Normal Total Score: 24       Pertinent Vitals/Pain Pain Assessment: No/denies pain    Home Living Family/patient expects to be discharged to:: Private residence Living Arrangements: Spouse/significant other;Children Available Help at Discharge: Family;Available 24 hours/day Type of Home: House Home Access: Stairs to enter Entrance Stairs-Rails: Right Entrance Stairs-Number of Steps: 5 Home Layout: Two level;Able to live on main level with bedroom/bathroom Home Equipment: None Additional Comments: children are upstairs    Prior Function Level of Independence: Independent         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 [employer]     Hand Dominance   Dominant Hand: Right    Extremity/Trunk Assessment   Upper Extremity Assessment: Overall WFL for tasks assessed           Lower Extremity Assessment: Overall WFL for tasks assessed      Cervical / Trunk Assessment: Normal  Communication   Communication: No difficulties  Cognition Arousal/Alertness: Awake/alert Behavior During Therapy: WFL for tasks assessed/performed Overall Cognitive Status: Within Functional Limits for tasks assessed                      General Comments      Exercises  Assessment/Plan    PT Assessment Patent does not need any further PT services  PT Diagnosis Generalized weakness   PT Problem List    PT Treatment Interventions     PT Goals (Current goals can be found in the Care Plan section) Acute Rehab PT Goals Patient Stated Goal: home PT Goal Formulation: All assessment and education complete, DC therapy    Frequency     Barriers to discharge         Co-evaluation               End of Session Equipment Utilized During Treatment: Gait belt Activity Tolerance: Patient tolerated treatment well Patient left: in bed;with call bell/phone within reach Nurse Communication: Mobility status    Functional Assessment Tool Used: clinical judgement Functional Limitation: Mobility: Walking and moving around Mobility: Walking and Moving Around Current Status [phone](G8978): 0 percent impaired, limited or restricted Mobility: Walking and Moving Around Goal Status [phone](G8979): 0 percent impaired, limited or restricted Mobility: Walking and Moving Around Discharge Status [phone](G8980): 0 percent impaired, limited or restricted    Time: 0823-0840 PT Time Calculation (min) (ACUTE ONLY): 17 min   Charges:   PT Evaluation $Initial PT Evaluation Tier I: 1 Procedure     PT G Codes:   PT G-Codes **NOT FOR INPATIENT CLASS** Functional Assessment Tool Used: clinical judgement Functional Limitation: Mobility: Walking and moving around Mobility: Walking and Moving Around Current Status ([id number](G8978): 0 percent impaired, limited or restricted Mobility: Walking and Moving Around Goal Status ([id number](G8979): 0 percent impaired, limited or restricted Mobility: Walking and Moving Around Discharge Status [phone](G8980): 0 percent impaired, limited or restricted    Marcene BrawnChadwell, Jacquiline Zurcher Marie 07/14/2014, 8:53 AM   Lewis ShockAshly Reakwon Barren, PT, DPT Pager #: [phone] Office #: [phone]

## 2014-07-15 DIAGNOSIS — R4781 Slurred speech: Secondary | ICD-10-CM

## 2014-07-15 DIAGNOSIS — I639 Cerebral infarction, unspecified: Secondary | ICD-10-CM

## 2014-07-15 LAB — GLUCOSE, CAPILLARY
GLUCOSE-CAPILLARY: 92 mg/dL (ref 65–99)
Glucose-Capillary: 97 mg/dL (ref 65–99)

## 2014-07-15 LAB — CBC
HEMATOCRIT: 47.8 % (ref 39.0–52.0)
Hemoglobin: 16 g/dL (ref 13.0–17.0)
MCH: 30.9 pg (ref 26.0–34.0)
MCHC: 33.5 g/dL (ref 30.0–36.0)
MCV: 92.5 fL (ref 78.0–100.0)
PLATELETS: 167 10*3/uL (ref 150–400)
RBC: 5.17 MIL/uL (ref 4.22–5.81)
RDW: 12.8 % (ref 11.5–15.5)
WBC: 6.7 10*3/uL (ref 4.0–10.5)

## 2014-07-15 LAB — BASIC METABOLIC PANEL
Anion gap: 8 (ref 5–15)
BUN: 9 mg/dL (ref 6–20)
CALCIUM: 9 mg/dL (ref 8.9–10.3)
CO2: 24 mmol/L (ref 22–32)
CREATININE: 0.77 mg/dL (ref 0.61–1.24)
Chloride: 107 mmol/L (ref 101–111)
GFR calc non Af Amer: >60 mL/min (ref >60)
Glucose, Bld: 98 mg/dL (ref 65–99)
POTASSIUM: 4 mmol/L (ref 3.5–5.1)
Sodium: 139 mmol/L (ref 135–145)

## 2014-07-15 LAB — HOMOCYSTEINE: Homocysteine: 10.9 umol/L (ref 0.0–15.0)

## 2014-07-15 MED ORDER — ATORVASTATIN CALCIUM 20 MG PO TABS: 20.0000 mg | ORAL_TABLET | Freq: Every day | ORAL | Status: DC

## 2014-07-15 MED ORDER — ASPIRIN EC 325 MG PO TBEC: 325.0000 mg | DELAYED_RELEASE_TABLET | Freq: Every day | ORAL | Status: DC

## 2014-07-15 NOTE — Discharge Summary (Signed)
[facility]  Physician Discharge Summary   Patient ID: Ryan Mitchell MRN: [id number] DOB/AGE: [birth year] 49 y.o.  Admit date: 07/13/2014 Discharge date: 07/15/2014  PCP: Leanor Rubenstein, MD  DISCHARGE DIAGNOSES:  Active Problems:   Hyperlipidemia   Elevated BP   Cerebral infarction due to embolism of right middle cerebral artery   Acute CVA (cerebrovascular accident)   RECOMMENDATIONS FOR OUTPATIENT FOLLOW UP: 1. Follow-up with neurology in 2 months 2. Hypercoagulable panel is pending   DISCHARGE CONDITION: fair  Diet recommendation: Heart healthy  Filed Weights   07/13/14 1447  Weight: 53.978 kg (119 lb)    INITIAL HISTORY: 49 year old male with a history of hyperlipidemia, although not on treatment, and no other medical problems presented with complains of right-sided facial droop with slurred speech and perhaps right-sided weakness, although he was not certain about this. He was admitted for further management.  Consultations:  Neurology  Procedures: 2-D echocardiogram Study Conclusions - Left ventricle: The cavity size was normal. Systolic function wasnormal. The estimated ejection fraction was in the range of 60%to 65%. Wall motion was normal; there were no regional wallmotion abnormalities. - Left atrium: The atrium was mildly dilated.  Carotid Doppler Findings suggest 1-39% internal carotid artery stenosis bilaterally. Vertebral arteries are patent with antegrade flow.  HOSPITAL COURSE:   Acute stroke involving right hemisphere Symptoms did not correspond with findings on MRI. He does not have any deficits currently. 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 Neurology was consulted. Stroke workup was completed. Recommendation is for aspirin 325 mg daily. No other workup recommended. Statin has been initiated. LDL 97. HbA1c is 5.9. He does not have any deficits currently. He was cleared by physical and occupational therapy. He may follow up with neurology as  an outpatient. Hypercoagulable panel was ordered and is pending.  Hyperlipidemia Initiated statin as discussed above.  Elevated blood pressure at the time of admission No known history of hypertension. Blood pressures are better and not in the hypertensive range. This can be monitored as outpatient.  Overall, stable. Patient very keen on going home. Discussed with neurology today. They have cleared him for discharge.   PERTINENT LABS:  The results of significant diagnostics from this hospitalization (including imaging, microbiology, ancillary and laboratory) are listed below for reference.     Labs: Basic Metabolic Panel:  Recent Labs Lab 07/13/14 1454 07/13/14 1516 07/15/14 0724  NA 141 141 139  K 4.1 4.0 4.0  CL 107 103 107  CO2 28  --  24  GLUCOSE 141* 139* 98  BUN 17 20 9   CREATININE 0.89 1.00 0.77  CALCIUM 9.2  --  9.0   Liver Function Tests:  Recent Labs Lab 07/13/14 1454  AST 20  ALT 22  ALKPHOS 74  BILITOT 0.7  PROT 6.3*  ALBUMIN 3.9   CBC:  Recent Labs Lab 07/13/14 1454 07/13/14 1516 07/15/14 0724  WBC 6.3  --  6.7  NEUTROABS 4.3  --   --   HGB 15.9 16.3 16.0  HCT 46.9 48.0 47.8  MCV 92.5  --  92.5  PLT 190  --  167   CBG:  Recent Labs Lab 07/14/14 1118 07/14/14 1601 07/14/14 2148 07/15/14 0634 07/15/14 1113  GLUCAP 97 101* 119* 97 92     IMAGING STUDIES Ct Head (brain) Wo Contrast  07/13/2014   CLINICAL DATA:  Right-sided facial droop and slurred speech, transient  EXAM: CT HEAD WITHOUT CONTRAST  TECHNIQUE: Contiguous axial images were obtained from the base of  the skull through the vertex without intravenous contrast.  COMPARISON:  None.  FINDINGS: The ventricles are normal in size and configuration. There is no intracranial mass, hemorrhage, extra-axial fluid collection, or midline shift. Gray-white compartments are normal. There is no demonstrable acute infarct. The bony calvarium appears intact. The mastoid air cells are clear.  There is rightward deviation of the nasal septum.  IMPRESSION: No intracranial mass, hemorrhage, or focal gray - white compartment lesions/acute appearing infarct. There is rightward deviation of the nasal septum.   Electronically Signed   By: Bretta Bang III M.D.   On: 07/13/2014 15:35   Mri Brain Without Contrast  07/13/2014   CLINICAL DATA:  Transient ischemic attack. Right facial droop, slurred speech in right lower extremity weakness which began approximately 6 hr ago. Speech disturbance as well.  EXAM: MRI HEAD WITHOUT CONTRAST  MRA HEAD WITHOUT CONTRAST  TECHNIQUE: Multiplanar, multiecho pulse sequences of the brain and surrounding structures were obtained without intravenous contrast. Angiographic images of the head were obtained using MRA technique without contrast.  COMPARISON:  Head CT same day  FINDINGS: MRI HEAD FINDINGS  There is a 1 cm acute infarction affecting the right frontal cortex near the vertex. No other acute infarction.  The brainstem and cerebellum are normal. There are mild chronic small-vessel ischemic changes affecting the cerebral hemispheric white matter. No large vessel territory infarction. No mass lesion, hemorrhage, hydrocephalus or extra-axial collection. No pituitary mass. No significant sinus disease.  MRA HEAD FINDINGS  Both internal carotid arteries are widely patent through the siphon regions. The anterior and middle cerebral vessels on the left are normal. No stenosis or occlusion. On the right, the anterior cerebral artery is normal. 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 More distal vessels do show flow.  Both vertebral arteries are widely patent to the basilar. No basilar stenosis. Posterior circulation branch vessels are normal. Right PCA takes a fetal origin from the anterior circulation.  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  Mild chronic small-vessel  change elsewhere within the cerebral hemispheric white matter.  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 More distal vessels do largely show reconstituted flow.   Electronically Signed   By: Paulina Fusi M.D.   On: 07/13/2014 19:11   Mr Maxine Glenn Head/brain Wo Cm  07/13/2014   CLINICAL DATA:  Transient ischemic attack. Right facial droop, slurred speech in right lower extremity weakness which began approximately 6 hr ago. Speech disturbance as well.  EXAM: MRI HEAD WITHOUT CONTRAST  MRA HEAD WITHOUT CONTRAST  TECHNIQUE: Multiplanar, multiecho pulse sequences of the brain and surrounding structures were obtained without intravenous contrast. Angiographic images of the head were obtained using MRA technique without contrast.  COMPARISON:  Head CT same day  FINDINGS: MRI HEAD FINDINGS  There is a 1 cm acute infarction affecting the right frontal cortex near the vertex. No other acute infarction.  The brainstem and cerebellum are normal. There are mild chronic small-vessel ischemic changes affecting the cerebral hemispheric white matter. No large vessel territory infarction. No mass lesion, hemorrhage, hydrocephalus or extra-axial collection. No pituitary mass. No significant sinus disease.  MRA HEAD FINDINGS  Both internal carotid arteries are widely patent through the siphon regions. The anterior and middle cerebral vessels on the left are normal. No stenosis or occlusion. On the right, the anterior  cerebral artery is normal. 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 More distal vessels do show flow.  Both vertebral arteries are widely patent to the basilar. No basilar stenosis. Posterior circulation branch vessels are normal. Right PCA takes a fetal origin from the anterior circulation.  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  Mild chronic small-vessel change  elsewhere within the cerebral hemispheric white matter.  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 More distal vessels do largely show reconstituted flow.   Electronically Signed   By: Paulina Fusi M.D.   On: 07/13/2014 19:11    DISCHARGE EXAMINATION: Filed Vitals:   07/14/14 2151 07/15/14 0130 07/15/14 0523 07/15/14 0923  BP: 133/87 128/83 118/72 119/80  Pulse: 56 58 57 68  Temp: 98.3 F (36.8 C) 97.8 F (36.6 C) 98.2 F (36.8 C) 98.1 F (36.7 C)  TempSrc: Oral Oral Oral Oral  Resp: Height:      Weight:      SpO2: 100% 98% 97% 99%   General appearance: alert, cooperative, appears stated age and no distress Resp: clear to auscultation bilaterally Cardio: regular rate and rhythm, S1, S2 normal, no murmur, click, rub or gallop GI: soft, non-tender; bowel sounds normal; no masses,  no organomegaly  DISPOSITION: Home  Discharge Instructions    Ambulatory referral to Neurology    Complete by:  As directed      Call MD for:  difficulty breathing, headache or visual disturbances    Complete by:  As directed      Call MD for:  extreme fatigue    Complete by:  As directed      Call MD for:  persistant dizziness or light-headedness    Complete by:  As directed      Call MD for:  persistant nausea and vomiting    Complete by:  As directed      Call MD for:  severe uncontrolled pain    Complete by:  As directed      Call MD for:  temperature >100.4    Complete by:  As directed      Discharge instructions    Complete by:  As directed   Please follow up with your PCP in 1 week for results of the blood work that are not back yet. And to make sure everything is stable. Refrain from strenuous activity for 1 week.  You were cared for by a hospitalist during your hospital stay. If you have any questions about your discharge medications or the care you received while you were in the hospital after you are discharged, you can call  the unit and asked to speak with the hospitalist on call if the hospitalist that took care of you is not available. Once you are discharged, your primary care physician will handle any further medical issues. Please note that NO REFILLS for any discharge medications will be authorized once you are discharged, as it is imperative that you return to your primary care physician (or establish a relationship with a primary care physician if you do not have one) for your aftercare needs so that they can reassess your need for medications and monitor your lab values. If you do not have a primary care physician, you can call [phone] for a physician referral.     Increase  activity slowly    Complete by:  As directed            ALLERGIES: No Known Allergies   Discharge Medication List as of 07/15/2014 11:28 AM    START taking these medications   Details  aspirin EC 325 MG tablet Take 1 tablet (325 mg total) by mouth daily., Starting 07/15/2014, Until Discontinued, Print    atorvastatin (LIPITOR) 20 MG tablet Take 1 tablet (20 mg total) by mouth daily at 6 PM., Starting 07/15/2014, Until Discontinued, Print       Follow-up Information    Follow up with Leanor RubensteinSUN,VYVYAN Y, MD. Schedule an appointment as soon as possible for a visit in 1 week.   Specialty:  Family Medicine   Why:  post hospitalization follow up and results of hypercoagulable panel.   Contact information:   [address] [place] [address] [phone]       Follow up with Xu,Jindong, MD. Schedule an appointment as soon as possible for a visit in 2 months.   Specialty:  Neurology   Why:  stroke follow up   Contact information:   [address] [place] [phone]       TOTAL DISCHARGE TIME: 35 minutes  [facility] HospitalKRISHNAN,Nazar Kuan  [facility] Pager [phone]  07/15/2014, 1:13 PM

## 2014-07-15 NOTE — Discharge Instructions (Signed)
Ischemic Stroke °A stroke (cerebrovascular accident) is the sudden death of brain tissue. It is a medical emergency. A stroke can cause permanent loss of brain function. This can cause problems with different parts of your body. A transient ischemic attack (TIA) is different because it does not cause permanent damage. A TIA is a short-lived problem of poor blood flow affecting a part of the brain. A TIA is also a serious problem because having a TIA greatly increases the chances of having a stroke. When symptoms first develop, you cannot know if the problem might be a stroke or a TIA. °CAUSES  °A stroke is caused by a decrease of oxygen supply to an area of your brain. It is usually the result of a small blood clot or collection of cholesterol or fat (plaque) that blocks blood flow in the brain. A stroke can also be caused by blocked or damaged carotid arteries.  °RISK FACTORS °· High blood pressure (hypertension). °· High cholesterol. °· Diabetes mellitus. °· Heart disease. °· The buildup of plaque in the blood vessels (peripheral artery disease or atherosclerosis). °· The buildup of plaque in the blood vessels providing blood and oxygen to the brain (carotid artery stenosis). °· An abnormal heart rhythm (atrial fibrillation). °· Obesity. °· Smoking. °· Taking oral contraceptives (especially in combination with smoking). °· Physical inactivity. °· A diet high in fats, salt (sodium), and calories. °· Alcohol use. °· Use of illegal drugs (especially cocaine and methamphetamine). °· Being African American. °· Being over the age of 55. °· Family history of stroke. °· Previous history of blood clots, stroke, TIA, or heart attack. °· Sickle cell disease. °SYMPTOMS  °These symptoms usually develop suddenly, or may be newly present upon awakening from sleep: °· Sudden weakness or numbness of the face, arm, or leg, especially on one side of the body. °· Sudden trouble walking or difficulty moving arms or legs. °· Sudden  confusion. °· Sudden personality changes. °· Trouble speaking (aphasia) or understanding. °· Difficulty swallowing. °· Sudden trouble seeing in one or both eyes. °· Double vision. °· Dizziness. °· Loss of balance or coordination. °· Sudden severe headache with no known cause. °· Trouble reading or writing. °DIAGNOSIS  °Your health care provider can often determine the presence or absence of a stroke based on your symptoms, history, and physical exam. Computed tomography (CT) of the brain is usually performed to confirm the stroke, determine causes, and determine stroke severity. Other tests may be done to find the cause of the stroke. These tests may include: °· Electrocardiography. °· Continuous heart monitoring. °· Echocardiography. °· Carotid ultrasonography. °· Magnetic resonance imaging (MRI). °· A scan of the brain circulation. °· Blood tests. °PREVENTION  °The risk of a stroke can be decreased by appropriately treating high blood pressure, high cholesterol, diabetes, heart disease, and obesity and by quitting smoking, limiting alcohol, and staying physically active. °TREATMENT  °Time is of the essence. It is important to seek treatment at the first sign of these symptoms because you may receive a medicine to dissolve the clot (thrombolytic) that cannot be given if too much time has passed since your symptoms began. Even if you do not know when your symptoms began, get treatment as soon as possible as there are other treatment options available including oxygen, intravenous (IV) fluids, and medicines to thin the blood (anticoagulants). Treatment of stroke depends on the duration, severity, and cause of your symptoms. Medicines and dietary changes may be used to address diabetes, high blood   pressure, and other risk factors. Physical, speech, and occupational therapists will assess you and work with you to improve any functions impaired by the stroke. Measures will be taken to prevent short-term and long-term  complications, including infection from breathing foreign material into the lungs (aspiration pneumonia), blood clots in the legs, bedsores, and falls. Rarely, surgery may be needed to remove large blood clots or to open up blocked arteries. °HOME CARE INSTRUCTIONS  °· Take medicines only as directed by your health care provider. Follow the directions carefully. Medicines may be used to control risk factors for a stroke. Be sure you understand all your medicine instructions. °· You may be told to take a medicine to thin the blood, such as aspirin or the anticoagulant warfarin. Warfarin needs to be taken exactly as instructed. °¨ Too much and too little warfarin are both dangerous. Too much warfarin increases the risk of bleeding. Too little warfarin continues to allow the risk for blood clots. While taking warfarin, you will need to have regular blood tests to measure your blood clotting time. These blood tests usually include both the PT and INR tests. The PT and INR results allow your health care provider to adjust your dose of warfarin. The dose can change for many reasons. It is critically important that you take warfarin exactly as prescribed, and that you have your PT and INR levels drawn exactly as directed. °¨ Many foods, especially foods high in vitamin K, can interfere with warfarin and affect the PT and INR results. Foods high in vitamin K include spinach, kale, broccoli, cabbage, collard and turnip greens, brussels sprouts, peas, cauliflower, seaweed, and parsley, as well as beef and pork liver, green tea, and soybean oil. You should eat a consistent amount of foods high in vitamin K. Avoid major changes in your diet, or notify your health care provider before changing your diet. Arrange a visit with a dietitian to answer your questions. °¨ Many medicines can interfere with warfarin and affect the PT and INR results. You must tell your health care provider about any and all medicines you take. This  includes all vitamins and supplements. Be especially cautious with aspirin and anti-inflammatory medicines. Do not take or discontinue any prescribed or over-the-counter medicine except on the advice of your health care provider or pharmacist. °¨ Warfarin can have side effects, such as excessive bruising or bleeding. You will need to hold pressure over cuts for longer than usual. Your health care provider or pharmacist will discuss other potential side effects. °¨ Avoid sports or activities that may cause injury or bleeding. °¨ Be mindful when shaving, flossing your teeth, or handling sharp objects. °¨ Alcohol can change the body's ability to handle warfarin. It is best to avoid alcoholic drinks or consume only very small amounts while taking warfarin. Notify your health care provider if you change your alcohol intake. °¨ Notify your dentist or other health care providers before procedures. °· If swallow studies have determined that your swallowing reflex is present, you should eat healthy foods. Including 5 or more servings of fruits and vegetables a day may reduce the risk of stroke. Foods may need to be a certain consistency (soft or pureed), or small bites may need to be taken in order to avoid aspirating or choking. Certain dietary changes may be advised to address high blood pressure, high cholesterol, diabetes, or obesity. °¨ Food choices that are low in sodium, saturated fat, trans fat, and cholesterol are recommended to manage high blood pressure. °¨   Food choies that are high in fiber, and low in saturated fat, trans fat, and cholesterol may control cholesterol levels. °¨ Controlling carbohydrates and sugar intake is recommended to manage diabetes. °¨ Reducing calorie intake and making food choices that are low in sodium, saturated fat, trans fat, and cholesterol are recommended to manage obesity. °· Maintain a healthy weight. °· Stay physically active. It is recommended that you get at least 30 minutes of  activity on all or most days. °· Do not use any tobacco products including cigarettes, chewing tobacco, or electronic cigarettes. °· Limit alcohol use even if you are not taking warfarin. Moderate alcohol use is considered to be: °¨ No more than 2 drinks each day for men. °¨ No more than 1 drink each day for nonpregnant women. °· Home safety. A safe home environment is important to reduce the risk of falls. Your health care provider may arrange for specialists to evaluate your home. Having grab bars in the bedroom and bathroom is often important. Your health care provider may arrange for equipment to be used at home, such as raised toilets and a seat for the shower. °· Physical, occupational, and speech therapy. Ongoing therapy may be needed to maximize your recovery after a stroke. If you have been advised to use a walker or a cane, use it at all times. Be sure to keep your therapy appointments. °· Follow all instructions for follow-up with your health care provider. This is very important. This includes any referrals, physical therapy, rehabilitation, and lab tests. Proper follow-up can prevent another stroke from occurring. °SEEK MEDICAL CARE IF: °· You have personality changes. °· You have difficulty swallowing. °· You are seeing double. °· You have dizziness. °· You have a fever. °· You have skin breakdown. °SEEK IMMEDIATE MEDICAL CARE IF:  °Any of these symptoms may represent a serious problem that is an emergency. Do not wait to see if the symptoms will go away. Get medical help right away. Call your local emergency services (911 in [place] Do not drive yourself to the hospital. °· You have sudden weakness or numbness of the face, arm, or leg, especially on one side of the body. °· You have sudden trouble walking or difficulty moving arms or legs. °· You have sudden confusion. °· You have trouble speaking (aphasia) or understanding. °· You have sudden trouble seeing in one or both eyes. °· You have a loss of  balance or coordination. °· You have a sudden, severe headache with no known cause. °· You have new chest pain or an irregular heartbeat. °· You have a partial or total loss of consciousness. °Document Released: 12/29/2004 Document Revised: 05/15/2013 Document Reviewed: 08/09/2011 °[employer]® Patient Information ©2015 [employer]. This information is not intended to replace advice given to you by your health care provider. Make sure you discuss any questions you have with your health care provider. ° °

## 2014-07-15 NOTE — Progress Notes (Signed)
Pt discharging at this time with his family alert, verbal taking all personal belongings. IV discontinued, dry dressing applied. Discharge instructions and prescriptions provided with verbal understanding. All questions answered. Pt is aware to follow up with PCP and Neuro per dc instructions. Pt signed up for EMMI/Transitioning signing consent. No noted distress.

## 2014-07-15 NOTE — Progress Notes (Signed)
STROKE TEAM PROGRESS NOTE   HISTORY Ryan Mitchell is a 49 y.o. male hx of HLD presenting for evaluation of transient episode of right facial droop, right LE weakness and slurred speech. LSW 1340 on 7/01 when he noted difficulty speaking and RLE weakness. Wife noted a right sided facial droop. Symptoms lasted around 20 minutes and then resolved. Asymptomatic upon arrival to ED.   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 Father had stroke at 81. He denies any clotting history. No history of A fib or heart palpitations.   Date last known well: 07/13/2014 Time last known well: 1340 tPA Given:no, outside tPA window at time of consult, asymptomatic upon arrival to ED   SUBJECTIVE (INTERVAL HISTORY) No family members present. The patient is without complaints. He feels he is back to baseline. Up ambulating in room without assistance.   OBJECTIVE Temp:  [97.7 F (36.5 C)-98.3 F (36.8 C)] 98.2 F (36.8 C) (07/03 0523) Pulse Rate:  [56-88] 57 (07/03 0523) Cardiac Rhythm:  [-] Normal sinus rhythm (07/02 2048) Resp:  [16-20] 18 (07/03 0523) BP: (118-133)/(72-87) 118/72 mmHg (07/03 0523) SpO2:  [97 %-100 %] 97 % (07/03 0523)   Recent Labs Lab 07/14/14 0642 07/14/14 1118 07/14/14 1601 07/14/14 2148 07/15/14 0634  GLUCAP 96 97 101* 119* 97    Recent Labs Lab 07/13/14 1454 07/13/14 1516  NA 141 141  K 4.1 4.0  CL 107 103  CO2 28  --   GLUCOSE 141* 139*  BUN 17 20  CREATININE 0.89 1.00  CALCIUM 9.2  --     Recent Labs Lab 07/13/14 1454  AST 20  ALT 22  ALKPHOS 74  BILITOT 0.7  PROT 6.3*  ALBUMIN 3.9    Recent Labs Lab 07/13/14 1454 07/13/14 1516  WBC 6.3  --   NEUTROABS 4.3  --   HGB 15.9 16.3  HCT 46.9 48.0  MCV 92.5  --   PLT 190  --    No results for input(s): CKTOTAL, CKMB, CKMBINDEX, TROPONINI in the last 168 hours.  Recent Labs  07/13/14 1454  LABPROT 12.8  INR 0.94    No results for input(s): COLORURINE, LABSPEC, PHURINE, GLUCOSEU, HGBUR, BILIRUBINUR, KETONESUR, PROTEINUR, UROBILINOGEN, NITRITE, LEUKOCYTESUR in the last 72 hours.  Invalid input(s): APPERANCEUR     Component Value Date/Time   CHOL 217* 07/14/2014 0503   TRIG 331* 07/14/2014 0503   HDL 54 07/14/2014 0503   CHOLHDL 4.0 07/14/2014 0503   VLDL 66* 07/14/2014 0503   LDLCALC 97 07/14/2014 0503   Lab Results  Component Value Date   HGBA1C 5.9* 07/13/2014      Component Value Date/Time   LABOPIA NONE DETECTED 07/13/2014 2015   COCAINSCRNUR NONE DETECTED 07/13/2014 2015   LABBENZ NONE DETECTED 07/13/2014 2015   AMPHETMU NONE DETECTED 07/13/2014 2015   THCU NONE DETECTED 07/13/2014 2015   LABBARB NONE DETECTED 07/13/2014 2015    No results for input(s): ETH in the last 168 hours.   Imaging    Ct Head (brain) Wo Contrast 07/13/2014    No intracranial mass, hemorrhage, or focal gray - white compartment lesions/acute appearing infarct. There is rightward deviation of the nasal septum.       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  Mild chronic small-vessel change elsewhere within the cerebral hemispheric white matter.  Severe stenosis  at the right MCA trifurcation which could be due to a plaque or an embolus. More distal vessels do largely show reconstituted flow.       PHYSICAL EXAM NEUROLOGIC:   MENTAL STATUS: awake, alert, oriented, language fluent,  follows simple commands.  CRANIAL NERVES: pupils equal and reactive to light,extraocular muscles intact, facial sensation and strength symmetric, uvula midlinec, tongue midline MOTOR: normal bulk and tone, Strength - 5/5 throughout SENSORY: normal and symmetric to light touch  COORDINATION: finger-nose-finger normal - heel to shin normal  REFLEXES: deep tendon reflexes present and symmetric - no babinski     ASSESSMENT/PLAN Mr. Ryan Mitchell is a 49 y.o. male  with history of hyperlipidemia presenting with right facial droop, right lower extremity weakness, and slurred speech.. He did not receive IV t-PA due to late presentation and resolution of deficits.  Stroke:  Non-dominant  infarct possibly embolic with undetermined source.   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 Vertebral arteries are patent with antegrade flow.  2D Echo EF 60-65%. No cardiac source of emboli identified.  LDL 97  HgbA1c 5.9  Lovenox for VTE prophylaxis  Diet Heart Room service appropriate?: Yes; Fluid consistency:: Thin  no antithrombotic prior to admission, now on aspirin 325 mg orally every day  Patient counseled to be compliant with his antithrombotic medications  Ongoing aggressive stroke risk factor management  Therapy recommendations: No further therapy indicated  Disposition: Pending  Hypertension  Home meds: No antihypertensives medications prior to admission.  Stable    Hyperlipidemia  Home meds:  No lipid lowering medications prior to admission.  LDL 97, goal < 70  Now on Lipitor 20 mg daily  Continue statin at discharge    Other Stroke Risk Factors  ETOH use   PLAN:  Workup complete. No further therapies indicated.  Continue aspirin 325 mg daily and Lipitor 20 mg daily (no need for dual antiplatelet therapy )  Per Dr. Loretha Mitchell - okay for discharge today  Follow-up with Dr. Roda Mitchell in 2 months   Hospital day # 2  Ryan Mitchell [phone] 07/15/2014, 11:40 AM    D/c today. ASA and lipitor as out pt.  Pt was not on any ASA at home.  Ryan Mitchell, Ryan Mitchell  To contact Stroke Continuity provider, please refer to [email or web address]. After hours, contact General  Neurology

## 2014-07-16 ENCOUNTER — Other Ambulatory Visit: Payer: Self-pay | Admitting: Neurology

## 2014-07-16 DIAGNOSIS — I63411 Cerebral infarction due to embolism of right middle cerebral artery: Secondary | ICD-10-CM

## 2014-07-16 LAB — PROTEIN C, TOTAL: PROTEIN C, TOTAL: 95 % (ref 70–140)

## 2014-07-16 MED ORDER — CLOPIDOGREL BISULFATE 75 MG PO TABS: 75.0000 mg | ORAL_TABLET | Freq: Every day | ORAL | Status: DC

## 2014-07-16 MED ORDER — ASPIRIN EC 81 MG PO TBEC: 81.0000 mg | DELAYED_RELEASE_TABLET | Freq: Every day | ORAL | Status: DC

## 2014-07-17 LAB — PROTEIN S ACTIVITY: PROTEIN S ACTIVITY: 98 % (ref 60–145)

## 2014-07-17 LAB — CARDIOLIPIN ANTIBODIES, IGG, IGM, IGA: Anticardiolipin IgA: <9 APL U/mL (ref 0–11)

## 2014-07-17 LAB — LUPUS ANTICOAGULANT PANEL
DRVVT: 37.3 s (ref 0.0–55.1)
PTT Lupus Anticoagulant: 41.1 s (ref 0.0–50.0)

## 2014-07-17 LAB — PROTEIN C ACTIVITY: PROTEIN C ACTIVITY: 159 % — AB (ref 74–151)

## 2014-07-17 LAB — PROTEIN S, TOTAL: Protein S Ag, Total: 116 % (ref 58–150)

## 2014-07-18 LAB — BETA-2-GLYCOPROTEIN I ABS, IGG/M/A: Beta-2-Glycoprotein I IgM: <9 GPI IgM units (ref 0–32)

## 2014-07-18 LAB — PROTHROMBIN GENE MUTATION

## 2014-07-19 LAB — FACTOR 5 LEIDEN

## 2014-07-19 NOTE — Patient Outreach (Signed)
[facility] Care Management  07/19/2014  Judson RochSanjun Glomski 12/13/1965 [id number]   Faxed consent yet to be received from inpatient unit.  Patient verbally agreed to begin Emmi calls 07/18/14.

## 2014-08-30 ENCOUNTER — Telehealth: Payer: Self-pay | Admitting: Neurology

## 2014-08-30 NOTE — Telephone Encounter (Signed)
Patient called stated no one has called him to schedule 2 month Stroke f/u 07/13/14 ( I just scheduled that for Monday 09/03/14--patient will be in [place] from 9/24-10/15, 9/20-9/23 pt will be in [place]). Patient also mentioned that Dr Roda Shutters ordered tests to be done (mentions this is supposed to be done prior to f/u appt). Tests to be done are an enhanced CT and a heart ultrasound.

## 2014-09-03 ENCOUNTER — Encounter: Payer: Self-pay | Admitting: Neurology

## 2014-09-03 ENCOUNTER — Ambulatory Visit (INDEPENDENT_AMBULATORY_CARE_PROVIDER_SITE_OTHER): Payer: BC Managed Care – PPO | Admitting: Neurology

## 2014-09-03 VITALS — BP 128/80 | HR 55 | Ht 59.0 in | Wt 124.4 lb

## 2014-09-03 DIAGNOSIS — I679 Cerebrovascular disease, unspecified: Secondary | ICD-10-CM | POA: Diagnosis not present

## 2014-09-03 DIAGNOSIS — I63411 Cerebral infarction due to embolism of right middle cerebral artery: Secondary | ICD-10-CM

## 2014-09-03 DIAGNOSIS — E785 Hyperlipidemia, unspecified: Secondary | ICD-10-CM | POA: Diagnosis not present

## 2014-09-03 NOTE — Patient Instructions (Signed)
-   continue plavix and ASA for total 3 months and then plavix alone - continue lipitor for stroke prevention - Follow up with your primary care physician for stroke risk factor modification. Recommend maintain blood pressure goal <130/80, diabetes with hemoglobin A1c goal below 6.5% and lipids with LDL cholesterol goal below 70 mg/dL.  - will do TEE, and CTA head and neck. - consider loop recorder if TEE negative. - follow up in 2 months.

## 2014-09-03 NOTE — Telephone Encounter (Signed)
Rn check patient in for appt. He was given the appt reminder for TEE on 09-10-14 at [facility] out patient dept. He was given instructions in writing to be NPO after midnight on Sunday. Also to bring all list of medications and to have someone drive and pick him up from the procedure. Pt understands and verbalizes all of these instructions.

## 2014-09-03 NOTE — Telephone Encounter (Signed)
Talk to Gerri at outpatient scheduling for [facility]. Rn call [phone] to schedule patient for TEE. Pt is schedule for 09-10-14 at 0900am. Patient has an appt with Dr Roda Shutters today and will be notified of the appointment.

## 2014-09-05 DIAGNOSIS — I679 Cerebrovascular disease, unspecified: Secondary | ICD-10-CM | POA: Insufficient documentation

## 2014-09-05 NOTE — Addendum Note (Signed)
Addended by: Marvel Plan on: 09/05/2014 12:51 PM   Modules accepted: Level of Service

## 2014-09-05 NOTE — Progress Notes (Signed)
NEUROLOGY CLINIC NEW PATIENT NOTE  NAME: Ryan Mitchell DOB: 06-Feb-1965 REFERRING PHYSICIAN: Deatra James, MD  I saw [name] as a new consult in the neurovascular clinic today regarding  Chief Complaint  Patient presents with  . Follow-up  .  HPI: Ryan Mitchell is a 49 y.o. male with PMH of HLD who presents as a new patient for a stroke.   Patient was admitted 07/13/2014 for transient episode of right facial droop, right lower extremity weakness and slurry speech during work. The symptoms last about 20 minutes and then resolved. In ED patient became asymmetric at that time. However, MRI was done showing small infarct in the right frontal cortex. MRA also showed right MCA M1/M2 junction high-grade stenosis. There are also a questionable left M2 moderate stenosis. Other stroke workup including carotid Doppler and 2-D echo, A1c and hypercoagulable workup were unremarkable. However LDL 97. He was put on aspirin and Plavix dural antiplatelet and Lipitor on discharge.   During the interval time, he has no recurrent strokelike symptoms, report compliant with medications. Plan to do TEE as outpatient, currently scheduled next week.  He denies any history of hypertension, diabetes, A. fib or heart palpitations. Family history including father had stroke at age of 91. He denies any smoking, alcohol or illicit drugs. 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  Past Medical History  Diagnosis Date  . Stroke    History reviewed. No pertinent past surgical history. Family History  Problem Relation Age of Onset  . Stroke Father   . Hypertension Father    Current Outpatient Prescriptions  Medication Sig Dispense Refill  . aspirin EC 81 MG tablet Take 1 tablet (81 mg total) by mouth daily. 90 tablet 0  . atorvastatin (LIPITOR) 20 MG tablet Take 1 tablet (20 mg total) by mouth daily at 6 PM. 30 tablet 3  . clopidogrel (PLAVIX) 75 MG tablet Take 1 tablet (75 mg total) by mouth daily.  90 tablet 3   No current facility-administered medications for this visit.   No Known Allergies Social History   Social History  . Marital Status: Married    Spouse Name: N/A  . Number of Children: N/A  . Years of Education: N/A   Occupational History  . Not on file.   Social History Main Topics  . Smoking status: Never Smoker   . Smokeless tobacco: Not on file  . Alcohol Use: Yes     Comment: casual beer and wine on weekends  . Drug Use: Not on file  . Sexual Activity: Not on file   Other Topics Concern  . Not on file   Social History Narrative    Review of Systems Full 14 system review of systems performed and notable only for those listed, all others are neg:  Constitutional:   Cardiovascular:  Ear/Nose/Throat:   Skin:  Eyes:   Respiratory:   Gastroitestinal:   Genitourinary:  Hematology/Lymphatic:   Endocrine:  Musculoskeletal:   Allergy/Immunology:   Neurological:   Psychiatric:  Sleep: Snoring   Physical Exam  Filed Vitals:   09/03/14 1550  BP: 128/80  Pulse: 55    General - Well nourished, well developed, in no apparent distress.  Ophthalmologic - Sharp disc margins OU.  Cardiovascular - Regular rate and rhythm with no murmur. Carotid pulses were 2+ without bruits .   Neck - supple, no nuchal rigidity .  Mental Status -  Level of arousal and orientation to time, place, and person were  intact. Language including expression, naming, repetition, comprehension, reading, and writing was assessed and found intact. Attention span and concentration were normal. Recent and remote memory were intact. Fund of Knowledge was assessed and was intact.  Cranial Nerves II - XII - II - Visual field intact OU. III, IV, VI - Extraocular movements intact. V - Facial sensation intact bilaterally. VII - Facial movement intact bilaterally. VIII - Hearing & vestibular intact bilaterally. X - Palate elevates symmetrically. XI - Chin turning & shoulder shrug  intact bilaterally. XII - Tongue protrusion intact.  Motor Strength - The patient's strength was normal in all extremities and pronator drift was absent.  Bulk was normal and fasciculations were absent.   Motor Tone - Muscle tone was assessed at the neck and appendages and was normal.  Reflexes - The patient's reflexes were normal in all extremities and he had no pathological reflexes.  Sensory - Light touch, temperature/pinprick, vibration and proprioception, and Romberg testing were assessed and were normal.    Coordination - The patient had normal movements in the hands and feet with no ataxia or dysmetria.  Tremor was absent.  Gait and Station - The patient's transfers, posture, gait, station, and turns were observed as normal.   Imaging  I have personally reviewed the radiological images below and agree with the radiology interpretations.  Ct Head (brain) Wo Contrast 07/13/2014  No intracranial mass, hemorrhage, or focal gray - white compartment lesions/acute appearing infarct. There is rightward deviation of the nasal septum.   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 Mild chronic small-vessel change elsewhere within the cerebral hemispheric white matter. 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 More distal vessels do largely show reconstituted flow.  Carotid Doppler 1-39% internal carotid artery stenosis bilaterally. Vertebral arteries are patent with antegrade flow.  2D Echo EF 60-65%. No cardiac source of emboli identified.  Lab Review Component     Latest Ref Rng 07/13/2014 07/14/2014  Cholesterol     0 - 200 mg/dL  161 (H)  Triglycerides     <150 mg/dL  096 (H)  HDL Cholesterol     >40 mg/dL  54  Total CHOL/HDL Ratio       4.0  VLDL     0 - 40 mg/dL  66 (H)  LDL (calc)     0 - 99 mg/dL  97  PTT Lupus Anticoagulant     0.0 - 50.0 sec  41.1    DRVVT     0.0 - 55.1 sec  37.3  Lupus Anticoag Interp       Comment:  Beta-2 Glyco I IgG     0 - 20 GPI IgG units  <9  Beta-2-Glycoprotein I IgM     0 - 32 GPI IgM units  <9  Beta-2-Glycoprotein I IgA     0 - 25 GPI IgA units  <9  Anticardiolipin IgG     0 - 14 GPL U/mL  <9  Anticardiolipin IgM     0 - 12 MPL U/mL  <9  Anticardiolipin IgA     0 - 11 APL U/mL  <9  Hemoglobin A1C     4.8 - 5.6 % 5.9 (H)   Mean Plasma Glucose      123   Recommendations-F5LEID:       Comment  Comment       Comment  Recommendations-PTGENE:       Comment  Additional Information       Comment  Antithrombin Activity     75 - 120 %  100  Protein C Activity     74 - 151 %  159 (H)  Protein C, Total     70 - 140 %  95  Protein S Activity     60 - 145 %  98  Protein S Ag, Total     58 - 150 %  116  Homocysteine     0.0 - 15.0 umol/L  10.9   Assessment and Plan:   In summary, Treshaun Carrico is a 49 y.o. male with PMH of HLD was admitted on 07/13/2014 for transient episode of slurry speech, left facial droop and LUE weakness.  Symptoms resolved in 20 minutes. MRI showed small infarct in the right frontal cortex. MRA also showed right MCA M1/M2 junction high-grade stenosis. There are also a questionable left M2 moderate stenosis. Other stroke workup including carotid Doppler and 2-D echo, A1c and hypercoagulable workup were unremarkable except LDL 97. He was put on aspirin and Plavix dural antiplatelet and Lipitor on discharge. Currently, a symptomatic, waiting for further testing including TEE, CTA head and neck. If TEE negative, may consider droop recorder.  - continue plavix and ASA for total 3 months and then plavix alone - continue lipitor for stroke prevention - Follow up with your primary care physician for stroke risk factor modification. Recommend maintain blood pressure goal <130/80, diabetes with hemoglobin A1c goal below 6.5% and lipids with LDL cholesterol goal below 70 mg/dL.  - will do  TEE, and CTA head and neck. - consider loop recorder if TEE negative. - RTC in 2 months.   I recommend aggressive blood pressure control with a goal <130/80 mm Hg.  Lipids should be managed intensively, with a goal LDL < 70 mg/dL.  I encouraged the patient to discuss these important issues with his primary care physician.  I counseled the patient on measures to reduce stroke risk, including the importance of medication compliance, risk factor control, exercise, healthy diet, and avoidance of smoking.  I reviewed stroke warning signs and symptoms and appropriate actions to take if such occurs.   Thank you very much for the opportunity to participate in the care of this patient.  Please do not hesitate to call if any questions or concerns arise.  Orders Placed This Encounter  Procedures  . CT Angio Neck W/Cm &/Or Wo/Cm    Standing Status: Future     Number of Occurrences:      Standing Expiration Date: 11/05/2015    Order Specific Question:  Reason for Exam (SYMPTOM  OR DIAGNOSIS REQUIRED)    Answer:  stroke    Order Specific Question:  Preferred imaging location?    Answer:  Internal  . CT Angio Head W/Cm &/Or Wo Cm    Standing Status: Future     Number of Occurrences:      Standing Expiration Date: 11/05/2015    Order Specific Question:  Reason for Exam (SYMPTOM  OR DIAGNOSIS REQUIRED)    Answer:  stroke    Order Specific Question:  Preferred imaging location?    Answer:  Internal    No orders of the defined types were placed in this encounter.    Patient Instructions  - continue plavix and ASA for total 3 months and then plavix alone - continue lipitor for stroke prevention - Follow up with your  primary care physician for stroke risk factor modification. Recommend maintain blood pressure goal <130/80, diabetes with hemoglobin A1c goal below 6.5% and lipids with LDL cholesterol goal below 70 mg/dL.  - will do TEE, and CTA head and neck. - consider loop recorder if TEE negative. -  follow up in 2 months.     Marvel Plan, MD PhD [facility] [address], [address] [place], [place] [address] [phone]

## 2014-09-10 ENCOUNTER — Telehealth: Payer: Self-pay

## 2014-09-10 ENCOUNTER — Ambulatory Visit (HOSPITAL_COMMUNITY)
Admission: RE | Admit: 2014-09-10 | Discharge: 2014-09-10 | Disposition: A | Discharge status: Home / Self Care | Payer: BC Managed Care – PPO | Source: Ambulatory Visit | Attending: Neurology | Admitting: Neurology

## 2014-09-10 DIAGNOSIS — I63411 Cerebral infarction due to embolism of right middle cerebral artery: Secondary | ICD-10-CM

## 2014-09-10 NOTE — Telephone Encounter (Signed)
Receive a call from Jeri at [facility] concerning patients ECHO TEE that's schedule today. Natasha Mead stated pt will have to be reschedule because he was put on a different schedule. Patient is having a more invasive procedure and the techs are not allowed to do it.

## 2014-09-11 ENCOUNTER — Encounter (HOSPITAL_COMMUNITY): Payer: Self-pay

## 2014-09-11 ENCOUNTER — Ambulatory Visit (HOSPITAL_COMMUNITY)
Admission: RE | Admit: 2014-09-11 | Discharge: 2014-09-11 | Disposition: A | Discharge status: Home / Self Care | Payer: BC Managed Care – PPO | Source: Ambulatory Visit | Attending: Cardiology | Admitting: Cardiology

## 2014-09-11 ENCOUNTER — Encounter: Payer: Self-pay | Admitting: Cardiology

## 2014-09-11 ENCOUNTER — Encounter (HOSPITAL_COMMUNITY): Payer: Self-pay | Admitting: Anesthesiology

## 2014-09-11 ENCOUNTER — Encounter (HOSPITAL_COMMUNITY)
Admission: RE | Disposition: A | Discharge status: Home / Self Care | Payer: Self-pay | Source: Ambulatory Visit | Attending: Cardiology

## 2014-09-11 ENCOUNTER — Ambulatory Visit (HOSPITAL_BASED_OUTPATIENT_CLINIC_OR_DEPARTMENT_OTHER)
Admission: RE | Admit: 2014-09-11 | Discharge: 2014-09-11 | Disposition: A | Discharge status: Home / Self Care | Payer: BC Managed Care – PPO | Source: Ambulatory Visit | Attending: Cardiology | Admitting: Cardiology

## 2014-09-11 ENCOUNTER — Ambulatory Visit (HOSPITAL_BASED_OUTPATIENT_CLINIC_OR_DEPARTMENT_OTHER)
Admission: RE | Admit: 2014-09-11 | Discharge: 2014-09-11 | Disposition: A | Discharge status: Home / Self Care | Payer: BC Managed Care – PPO | Source: Ambulatory Visit | Attending: Nurse Practitioner | Admitting: Nurse Practitioner

## 2014-09-11 DIAGNOSIS — I63411 Cerebral infarction due to embolism of right middle cerebral artery: Secondary | ICD-10-CM | POA: Diagnosis present

## 2014-09-11 DIAGNOSIS — Z7902 Long term (current) use of antithrombotics/antiplatelets: Secondary | ICD-10-CM | POA: Diagnosis not present

## 2014-09-11 DIAGNOSIS — I639 Cerebral infarction, unspecified: Secondary | ICD-10-CM

## 2014-09-11 DIAGNOSIS — Z7982 Long term (current) use of aspirin: Secondary | ICD-10-CM | POA: Diagnosis not present

## 2014-09-11 DIAGNOSIS — Q211 Atrial septal defect: Secondary | ICD-10-CM | POA: Diagnosis not present

## 2014-09-11 DIAGNOSIS — I6523 Occlusion and stenosis of bilateral carotid arteries: Secondary | ICD-10-CM | POA: Insufficient documentation

## 2014-09-11 DIAGNOSIS — Z8673 Personal history of transient ischemic attack (TIA), and cerebral infarction without residual deficits: Secondary | ICD-10-CM | POA: Insufficient documentation

## 2014-09-11 DIAGNOSIS — E785 Hyperlipidemia, unspecified: Secondary | ICD-10-CM | POA: Diagnosis not present

## 2014-09-11 DIAGNOSIS — Z79899 Other long term (current) drug therapy: Secondary | ICD-10-CM | POA: Insufficient documentation

## 2014-09-11 DIAGNOSIS — I636 Cerebral infarction due to cerebral venous thrombosis, nonpyogenic: Secondary | ICD-10-CM

## 2014-09-11 DIAGNOSIS — Q2112 Patent foramen ovale: Secondary | ICD-10-CM

## 2014-09-11 HISTORY — PX: TEE WITHOUT CARDIOVERSION: SHX5443

## 2014-09-11 HISTORY — DX: Hyperlipidemia, unspecified: E78.5

## 2014-09-11 HISTORY — PX: EP IMPLANTABLE DEVICE: SHX172B

## 2014-09-11 SURGERY — LOOP RECORDER INSERTION

## 2014-09-11 SURGERY — ECHOCARDIOGRAM, TRANSESOPHAGEAL
Anesthesia: Moderate Sedation

## 2014-09-11 MED ORDER — MIDAZOLAM HCL 5 MG/ML IJ SOLN
INTRAMUSCULAR | Status: AC
  Filled 2014-09-11: qty 1

## 2014-09-11 MED ORDER — FENTANYL CITRATE (PF) 100 MCG/2ML IJ SOLN
INTRAMUSCULAR | Status: DC | PRN
  Administered 2014-09-11 (×2): 25 ug via INTRAVENOUS

## 2014-09-11 MED ORDER — BUTAMBEN-TETRACAINE-BENZOCAINE 2-2-14 % EX AERO
INHALATION_SPRAY | CUTANEOUS | Status: DC | PRN
  Administered 2014-09-11: 2 via TOPICAL

## 2014-09-11 MED ORDER — FENTANYL CITRATE (PF) 100 MCG/2ML IJ SOLN
INTRAMUSCULAR | Status: AC
  Filled 2014-09-11: qty 2

## 2014-09-11 MED ORDER — MIDAZOLAM HCL 10 MG/2ML IJ SOLN
INTRAMUSCULAR | Status: DC | PRN
  Administered 2014-09-11: 2 mg via INTRAVENOUS
  Administered 2014-09-11 (×2): 1 mg via INTRAVENOUS

## 2014-09-11 MED ORDER — LIDOCAINE-EPINEPHRINE 1 %-1:100000 IJ SOLN
INTRAMUSCULAR | Status: DC | PRN
  Administered 2014-09-11: 10 mL

## 2014-09-11 MED ORDER — LIDOCAINE-EPINEPHRINE 1 %-1:100000 IJ SOLN
INTRAMUSCULAR | Status: AC
  Filled 2014-09-11: qty 1

## 2014-09-11 MED ORDER — SODIUM CHLORIDE 0.9 % IV SOLN: INTRAVENOUS | Status: DC

## 2014-09-11 SURGICAL SUPPLY — 2 items
LOOP REVEAL LINQSYS (Prosthesis & Implant Heart) ×3 IMPLANT
PACK LOOP INSERTION (CUSTOM PROCEDURE TRAY) ×3 IMPLANT

## 2014-09-11 NOTE — H&P (View-Only) (Signed)
NEUROLOGY CLINIC NEW PATIENT NOTE  NAME: Ryan Mitchell DOB: 06-Feb-1965 REFERRING PHYSICIAN: Deatra James, MD  I saw Ryan Mitchell as a new consult in the neurovascular clinic today regarding  Chief Complaint  Patient presents with  . Follow-up  .  HPI: Ryan Mitchell is a 49 y.o. male with PMH of HLD who presents as a new patient for a stroke.   Patient was admitted 07/13/2014 for transient episode of right facial droop, right lower extremity weakness and slurry speech during work. The symptoms last about 20 minutes and then resolved. In ED patient became asymmetric at that time. However, MRI was done showing small infarct in the right frontal cortex. MRA also showed right MCA M1/M2 junction high-grade stenosis. There are also a questionable left M2 moderate stenosis. Other stroke workup including carotid Doppler and 2-D echo, A1c and hypercoagulable workup were unremarkable. However LDL 97. He was put on aspirin and Plavix dural antiplatelet and Lipitor on discharge.   During the interval time, he has no recurrent strokelike symptoms, report compliant with medications. Plan to do TEE as outpatient, currently scheduled next week.  He denies any history of hypertension, diabetes, A. fib or heart palpitations. Family history including father had stroke at age of 91. He denies any smoking, alcohol or illicit drugs. 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  Past Medical History  Diagnosis Date  . Stroke    History reviewed. No pertinent past surgical history. Family History  Problem Relation Age of Onset  . Stroke Father   . Hypertension Father    Current Outpatient Prescriptions  Medication Sig Dispense Refill  . aspirin EC 81 MG tablet Take 1 tablet (81 mg total) by mouth daily. 90 tablet 0  . atorvastatin (LIPITOR) 20 MG tablet Take 1 tablet (20 mg total) by mouth daily at 6 PM. 30 tablet 3  . clopidogrel (PLAVIX) 75 MG tablet Take 1 tablet (75 mg total) by mouth daily.  90 tablet 3   No current facility-administered medications for this visit.   No Known Allergies Social History   Social History  . Marital Status: Married    Spouse Name: N/A  . Number of Children: N/A  . Years of Education: N/A   Occupational History  . Not on file.   Social History Main Topics  . Smoking status: Never Smoker   . Smokeless tobacco: Not on file  . Alcohol Use: Yes     Comment: casual beer and wine on weekends  . Drug Use: Not on file  . Sexual Activity: Not on file   Other Topics Concern  . Not on file   Social History Narrative    Review of Systems Full 14 system review of systems performed and notable only for those listed, all others are neg:  Constitutional:   Cardiovascular:  Ear/Nose/Throat:   Skin:  Eyes:   Respiratory:   Gastroitestinal:   Genitourinary:  Hematology/Lymphatic:   Endocrine:  Musculoskeletal:   Allergy/Immunology:   Neurological:   Psychiatric:  Sleep: Snoring   Physical Exam  Filed Vitals:   09/03/14 1550  BP: 128/80  Pulse: 55    General - Well nourished, well developed, in no apparent distress.  Ophthalmologic - Sharp disc margins OU.  Cardiovascular - Regular rate and rhythm with no murmur. Carotid pulses were 2+ without bruits .   Neck - supple, no nuchal rigidity .  Mental Status -  Level of arousal and orientation to time, place, and person were  intact. Language including expression, naming, repetition, comprehension, reading, and writing was assessed and found intact. Attention span and concentration were normal. Recent and remote memory were intact. Fund of Knowledge was assessed and was intact.  Cranial Nerves II - XII - II - Visual field intact OU. III, IV, VI - Extraocular movements intact. V - Facial sensation intact bilaterally. VII - Facial movement intact bilaterally. VIII - Hearing & vestibular intact bilaterally. X - Palate elevates symmetrically. XI - Chin turning & shoulder shrug  intact bilaterally. XII - Tongue protrusion intact.  Motor Strength - The patient's strength was normal in all extremities and pronator drift was absent.  Bulk was normal and fasciculations were absent.   Motor Tone - Muscle tone was assessed at the neck and appendages and was normal.  Reflexes - The patient's reflexes were normal in all extremities and he had no pathological reflexes.  Sensory - Light touch, temperature/pinprick, vibration and proprioception, and Romberg testing were assessed and were normal.    Coordination - The patient had normal movements in the hands and feet with no ataxia or dysmetria.  Tremor was absent.  Gait and Station - The patient's transfers, posture, gait, station, and turns were observed as normal.   Imaging  I have personally reviewed the radiological images below and agree with the radiology interpretations.  Ct Head (brain) Wo Contrast 07/13/2014  No intracranial mass, hemorrhage, or focal gray - white compartment lesions/acute appearing infarct. There is rightward deviation of the nasal septum.   Mr Ryan Mitchell Head/brain Wo Cm 07/13/2014  1 cm acute infarction in the right frontal cortex near the vertex. Mild chronic small-vessel change elsewhere within the cerebral hemispheric white matter. 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 More distal vessels do largely show reconstituted flow.  Carotid Doppler 1-39% internal carotid artery stenosis bilaterally. Vertebral arteries are patent with antegrade flow.  2D Echo EF 60-65%. No cardiac source of emboli identified.  Lab Review Component     Latest Ref Rng 07/13/2014 07/14/2014  Cholesterol     0 - 200 mg/dL  161 (H)  Triglycerides     <150 mg/dL  096 (H)  HDL Cholesterol     >40 mg/dL  54  Total CHOL/HDL Ratio       4.0  VLDL     0 - 40 mg/dL  66 (H)  LDL (calc)     0 - 99 mg/dL  97  PTT Lupus Anticoagulant     0.0 - 50.0 sec  41.1    DRVVT     0.0 - 55.1 sec  37.3  Lupus Anticoag Interp       Comment:  Beta-2 Glyco I IgG     0 - 20 GPI IgG units  <9  Beta-2-Glycoprotein I IgM     0 - 32 GPI IgM units  <9  Beta-2-Glycoprotein I IgA     0 - 25 GPI IgA units  <9  Anticardiolipin IgG     0 - 14 GPL U/mL  <9  Anticardiolipin IgM     0 - 12 MPL U/mL  <9  Anticardiolipin IgA     0 - 11 APL U/mL  <9  Hemoglobin A1C     4.8 - 5.6 % 5.9 (H)   Mean Plasma Glucose      123   Recommendations-F5LEID:       Comment  Comment       Comment  Recommendations-PTGENE:       Comment  Additional Information       Comment  Antithrombin Activity     75 - 120 %  100  Protein C Activity     74 - 151 %  159 (H)  Protein C, Total     70 - 140 %  95  Protein S Activity     60 - 145 %  98  Protein S Ag, Total     58 - 150 %  116  Homocysteine     0.0 - 15.0 umol/L  10.9   Assessment and Plan:   In summary, Ryan Mitchell is a 49 y.o. male with PMH of HLD was admitted on 07/13/2014 for transient episode of slurry speech, left facial droop and LUE weakness.  Symptoms resolved in 20 minutes. MRI showed small infarct in the right frontal cortex. MRA also showed right MCA M1/M2 junction high-grade stenosis. There are also a questionable left M2 moderate stenosis. Other stroke workup including carotid Doppler and 2-D echo, A1c and hypercoagulable workup were unremarkable except LDL 97. He was put on aspirin and Plavix dural antiplatelet and Lipitor on discharge. Currently, a symptomatic, waiting for further testing including TEE, CTA head and neck. If TEE negative, may consider droop recorder.  - continue plavix and ASA for total 3 months and then plavix alone - continue lipitor for stroke prevention - Follow up with your primary care physician for stroke risk factor modification. Recommend maintain blood pressure goal <130/80, diabetes with hemoglobin A1c goal below 6.5% and lipids with LDL cholesterol goal below 70 mg/dL.  - will do  TEE, and CTA head and neck. - consider loop recorder if TEE negative. - RTC in 2 months.   I recommend aggressive blood pressure control with a goal <130/80 mm Hg.  Lipids should be managed intensively, with a goal LDL < 70 mg/dL.  I encouraged the patient to discuss these important issues with his primary care physician.  I counseled the patient on measures to reduce stroke risk, including the importance of medication compliance, risk factor control, exercise, healthy diet, and avoidance of smoking.  I reviewed stroke warning signs and symptoms and appropriate actions to take if such occurs.   Thank you very much for the opportunity to participate in the care of this patient.  Please do not hesitate to call if any questions or concerns arise.  Orders Placed This Encounter  Procedures  . CT Angio Neck W/Cm &/Or Wo/Cm    Standing Status: Future     Number of Occurrences:      Standing Expiration Date: 11/05/2015    Order Specific Question:  Reason for Exam (SYMPTOM  OR DIAGNOSIS REQUIRED)    Answer:  stroke    Order Specific Question:  Preferred imaging location?    Answer:  Internal  . CT Angio Head W/Cm &/Or Wo Cm    Standing Status: Future     Number of Occurrences:      Standing Expiration Date: 11/05/2015    Order Specific Question:  Reason for Exam (SYMPTOM  OR DIAGNOSIS REQUIRED)    Answer:  stroke    Order Specific Question:  Preferred imaging location?    Answer:  Internal    No orders of the defined types were placed in this encounter.    Patient Instructions  - continue plavix and ASA for total 3 months and then plavix alone - continue lipitor for stroke prevention - Follow up with your  primary care physician for stroke risk factor modification. Recommend maintain blood pressure goal <130/80, diabetes with hemoglobin A1c goal below 6.5% and lipids with LDL cholesterol goal below 70 mg/dL.  - will do TEE, and CTA head and neck. - consider loop recorder if TEE negative. -  follow up in 2 months.     Marvel Plan, MD PhD [facility] [address], [address] [place], [place] [address] [phone]

## 2014-09-11 NOTE — Interval H&P Note (Signed)
History and Physical Interval Note:  09/11/2014 12:49 PM  Ryan Mitchell  has presented today for surgery, with the diagnosis of snycope  The various methods of treatment have been discussed with the patient and family. After consideration of risks, benefits and other options for treatment, the patient has consented to  Procedure(s): Loop Recorder Insertion (N/A) as a surgical intervention .  The patient's history has been reviewed, patient examined, no change in status, stable for surgery.  I have reviewed the patient's chart and labs.  Questions were answered to the patient's satisfaction.     Hillis Range

## 2014-09-11 NOTE — Interval H&P Note (Signed)
History and Physical Interval Note:  09/11/2014 8:39 AM  Ryan Mitchell  has presented today for surgery, with the diagnosis of cereberal infraction   The various methods of treatment have been discussed with the patient and family. After consideration of risks, benefits and other options for treatment, the patient has consented to  Procedure(s): TRANSESOPHAGEAL ECHOCARDIOGRAM (TEE) (N/A) as a surgical intervention .  The patient's history has been reviewed, patient examined, no change in status, stable for surgery.  I have reviewed the patient's chart and labs.  Questions were answered to the patient's satisfaction.     Lars Masson

## 2014-09-11 NOTE — CV Procedure (Signed)
    Transesophageal Echocardiogram Note  Daran Favaro [id number] December 24, 1965  Procedure: Transesophageal Echocardiogram Indications: Stroke  Procedure Details Consent: Obtained Time Out: Verified patient identification, verified procedure, site/side was marked, verified correct patient position, special equipment/implants available, Radiology Safety Procedures followed,  medications/allergies/relevent history reviewed, required imaging and test results available.  Performed  Medications: Fentanyl: 50 mcg Versed: 5 mg  Left Ventrical:  The cavity size was normal. Systolic function was normal. The estimated ejection fraction was in the range of 60% to 65%. Wall motion was normal; there were no regional wall motion abnormalities.  Mitral Valve: Normal.  Aortic Valve: Normal.  Tricuspid Valve: Mild TR.  Pulmonic Valve: Mild PR.  Left Atrium/ Left atrial appendage: Normal velocities, no clot.  Atrial septum: Present PFO.  Aorta: Minimal plaque.  Complications: No apparent complications Patient did tolerate procedure well.  Lars Masson, MD, [facility] 09/11/2014, 8:41 AM

## 2014-09-11 NOTE — Consult Note (Signed)
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  He had no warning prior to symptoms.  His symptoms lasted for 20 minutes and then resolved.  MRI demonstrated small infarct in the right frontal cortex.  He was discharged to home on ASA and Plavix and was seen by Dr Roda Shutters in the outpatient clinic last week for further evaluation.    He works as a Airline pilot at [employer].  He has recovered fully from his stroke without residual symptoms.   Echocardiogram 07/2014 demonstrated EF 60-65%, no RWMA, LA 26.  Prior to admission, the patient denies chest pain, shortness of breath, dizziness, palpitations, or syncope.    EP has been asked to evaluate for placement of an implantable loop recorder to monitor for atrial fibrillation.     Past Medical History  Diagnosis Date  . Stroke   . Hyperlipidemia      Surgical History:  Past Surgical History  Procedure Laterality Date  . No past surgeries       Prescriptions prior to admission  Medication Sig Dispense Refill Last Dose  . aspirin EC 81 MG tablet Take 1 tablet (81 mg total) by mouth daily. 90 tablet 0 09/10/2014 at 1200  . atorvastatin (LIPITOR) 20 MG tablet Take 1 tablet (20 mg total) by mouth daily at 6 PM. 30 tablet 3 09/10/2014 at 1930  . clopidogrel (PLAVIX) 75 MG tablet Take 1 tablet (75 mg total) by mouth daily. 90 tablet 3 09/10/2014 at 1200    Inpatient Medications:    Allergies: No Known Allergies  Social History   Social History    . Marital Status: Married    Spouse Name: N/A  . Number of Children: N/A  . Years of Education: N/A   Occupational History  . Not on file.   Social History Main Topics  . Smoking status: Never Smoker   . Smokeless tobacco: Not on file  . Alcohol Use: Yes     Comment: casual beer and wine on weekends  . Drug Use: Not on file  . Sexual Activity: Not on file   Other Topics Concern  . Not on file   Social History Narrative     Family History  Problem Relation Age of Onset  . Stroke Father   . Hypertension Father       Review of Systems: General: No chills, fever, night sweats or weight changes  Cardiovascular:  No chest pain, dyspnea on exertion, edema, orthopnea, palpitations, paroxysmal nocturnal dyspnea Dermatological: No rash, lesions or masses Respiratory: No cough, dyspnea Urologic: No hematuria, dysuria Abdominal: No nausea, vomiting, diarrhea, bright red blood per rectum, melena, or hematemesis Neurologic: No visual changes, weakness, changes in mental status since discharge All other systems reviewed and are otherwise negative except as noted above.  Physical Exam: Filed Vitals:   09/11/14 0852  BP: 133/84  Pulse: 60  TempSrc: Oral  Resp: 15  Height:  (1.499 m)  Weight: 120 lb (54.432 kg)  SpO2: 98%    GEN- The patient is well appearing, alert and oriented x 3 today.  Head- normocephalic, atraumatic Eyes-  Sclera clear, conjunctiva pink Ears- hearing intact Oropharynx- clear Neck- supple Lungs- Clear to ausculation bilaterally, normal work of breathing Heart- Regular rate and rhythm  GI- soft, NT, ND, + BS Extremities- no clubbing, cyanosis, or edema MS- no significant deformity or atrophy Skin- no rash or lesion Psych- euthymic mood, full affect   Labs:   Lab Results  Component Value Date   WBC 6.7 07/15/2014   HGB 16.0 07/15/2014   HCT 47.8 07/15/2014   MCV 92.5 07/15/2014   PLT 167 07/15/2014   No results for input(s): NA, K,  CL, CO2, BUN, CREATININE, CALCIUM, PROT, BILITOT, ALKPHOS, ALT, AST, GLUCOSE in the last 168 hours.  Invalid input(s): LABALBU   12-lead ECG sinus rhythm, rate 64, normal intervals  Telemetry CV strips reviewed from previous admission and demonstrate sinus rhythm with no arrhythmias  Assessment and Plan:  1. Cryptogenic stroke The patient presented with cryptogenic stroke 07/2014.  The patient has a TEE planned for this AM.  I spoke at length with the patient about monitoring for afib with an implantable loop recorder.  Risks, benefits, and alteratives to implantable loop recorder were discussed with the patient today.   At this time, the patient is very clear in their decision to proceed with implantable loop recorder.   Wound care was reviewed with the patient (keep incision clean and dry for 3 days).  Wound check scheduled for 09/20/14 at 9:30AM  Please call with questions.   Marily Lente, NP 09/11/2014 9:50 AM  I have seen, examined the patient, and reviewed the above assessment and plan.  On exam, RRR. Changes to above are made where necessary. Venous dopplers reveal no DVT. Proceed with ILR implant for cryptogenic stroke protocol.  Co Sign: Hillis Range, MD 09/11/2014 12:47 PM

## 2014-09-11 NOTE — Progress Notes (Signed)
VASCULAR LAB PRELIMINARY  PRELIMINARY  PRELIMINARY  PRELIMINARY  Bilateral lower extremity venous duplex completed.    Preliminary report:  Bilateral:  No evidence of DVT, superficial thrombosis, or Baker's Cyst.   [place] Welcome, RVS 09/11/2014, 12:14 PM

## 2014-09-11 NOTE — Discharge Instructions (Signed)
Transesophageal Echocardiogram °Transesophageal echocardiography (TEE) is a picture test of your heart using sound waves. The pictures taken can give very detailed pictures of your heart. This can help your doctor see if there are problems with your heart. TEE can check: °· If your heart has blood clots in it. °· How well your heart valves are working. °· If you have an infection on the inside of your heart. °· Some of the major arteries of your heart. °· If your heart valve is working after a repair. °· Your heart before a procedure that uses a shock to your heart to get the rhythm back to normal. °BEFORE THE PROCEDURE °· Do not eat or drink for 6 hours before the procedure or as told by your doctor. °· Make plans to have someone drive you home after the procedure. Do not drive yourself home. °· An IV tube will be put in your arm. °PROCEDURE °· You will be given a medicine to help you relax (sedative). It will be given through the IV tube. °· A numbing medicine will be sprayed or gargled in the back of your throat to help numb it. °· The tip of the probe is placed into the back of your mouth. You will be asked to swallow. This helps to pass the probe into your esophagus. °· Once the tip of the probe is in the right place, your doctor can take pictures of your heart. °· You may feel pressure at the back of your throat. °AFTER THE PROCEDURE °· You will be taken to a recovery area so the sedative can wear off. °· Your throat may be sore and scratchy. This will go away slowly over time. °· You will go home when you are fully awake and able to swallow liquids. °· You should have someone stay with you for the next 24 hours. °· Do not drive or operate machinery for the next 24 hours. °Document Released: 10/26/2008 Document Revised: 01/03/2013 Document Reviewed: 06/30/2012 °[employer]® Patient Information ©2015 [employer]. This information is not intended to replace advice given to you by your health care provider. Make  sure you discuss any questions you have with your health care provider. ° °

## 2014-09-11 NOTE — H&P (View-Only) (Signed)
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  He had no warning prior to symptoms.  His symptoms lasted for 20 minutes and then resolved.  MRI demonstrated small infarct in the right frontal cortex.  He was discharged to home on ASA and Plavix and was seen by Dr Roda Shutters in the outpatient clinic last week for further evaluation.    He works as a Airline pilot at [employer].  He has recovered fully from his stroke without residual symptoms.   Echocardiogram 07/2014 demonstrated EF 60-65%, no RWMA, LA 26.  Prior to admission, the patient denies chest pain, shortness of breath, dizziness, palpitations, or syncope.    EP has been asked to evaluate for placement of an implantable loop recorder to monitor for atrial fibrillation.     Past Medical History  Diagnosis Date  . Stroke   . Hyperlipidemia      Surgical History:  Past Surgical History  Procedure Laterality Date  . No past surgeries       Prescriptions prior to admission  Medication Sig Dispense Refill Last Dose  . aspirin EC 81 MG tablet Take 1 tablet (81 mg total) by mouth daily. 90 tablet 0 09/10/2014 at 1200  . atorvastatin (LIPITOR) 20 MG tablet Take 1 tablet (20 mg total) by mouth daily at 6 PM. 30 tablet 3 09/10/2014 at 1930  . clopidogrel (PLAVIX) 75 MG tablet Take 1 tablet (75 mg total) by mouth daily. 90 tablet 3 09/10/2014 at 1200    Inpatient Medications:    Allergies: No Known Allergies  Social History   Social History    . Marital Status: Married    Spouse Name: N/A  . Number of Children: N/A  . Years of Education: N/A   Occupational History  . Not on file.   Social History Main Topics  . Smoking status: Never Smoker   . Smokeless tobacco: Not on file  . Alcohol Use: Yes     Comment: casual beer and wine on weekends  . Drug Use: Not on file  . Sexual Activity: Not on file   Other Topics Concern  . Not on file   Social History Narrative     Family History  Problem Relation Age of Onset  . Stroke Father   . Hypertension Father       Review of Systems: General: No chills, fever, night sweats or weight changes  Cardiovascular:  No chest pain, dyspnea on exertion, edema, orthopnea, palpitations, paroxysmal nocturnal dyspnea Dermatological: No rash, lesions or masses Respiratory: No cough, dyspnea Urologic: No hematuria, dysuria Abdominal: No nausea, vomiting, diarrhea, bright red blood per rectum, melena, or hematemesis Neurologic: No visual changes, weakness, changes in mental status since discharge All other systems reviewed and are otherwise negative except as noted above.  Physical Exam: Filed Vitals:   09/11/14 0852  BP: 133/84  Pulse: 60  TempSrc: Oral  Resp: 15  Height:  (1.499 m)  Weight: 120 lb (54.432 kg)  SpO2: 98%    GEN- The patient is well appearing, alert and oriented x 3 today.  Head- normocephalic, atraumatic Eyes-  Sclera clear, conjunctiva pink Ears- hearing intact Oropharynx- clear Neck- supple Lungs- Clear to ausculation bilaterally, normal work of breathing Heart- Regular rate and rhythm  GI- soft, NT, ND, + BS Extremities- no clubbing, cyanosis, or edema MS- no significant deformity or atrophy Skin- no rash or lesion Psych- euthymic mood, full affect   Labs:   Lab Results  Component Value Date   WBC 6.7 07/15/2014   HGB 16.0 07/15/2014   HCT 47.8 07/15/2014   MCV 92.5 07/15/2014   PLT 167 07/15/2014   No results for input(s): NA, K,  CL, CO2, BUN, CREATININE, CALCIUM, PROT, BILITOT, ALKPHOS, ALT, AST, GLUCOSE in the last 168 hours.  Invalid input(s): LABALBU   12-lead ECG sinus rhythm, rate 64, normal intervals  Telemetry CV strips reviewed from previous admission and demonstrate sinus rhythm with no arrhythmias  Assessment and Plan:  1. Cryptogenic stroke The patient presented with cryptogenic stroke 07/2014.  The patient has a TEE planned for this AM.  I spoke at length with the patient about monitoring for afib with an implantable loop recorder.  Risks, benefits, and alteratives to implantable loop recorder were discussed with the patient today.   At this time, the patient is very clear in their decision to proceed with implantable loop recorder.   Wound care was reviewed with the patient (keep incision clean and dry for 3 days).  Wound check scheduled for 09/20/14 at 9:30AM  Please call with questions.   Marily Lente, NP 09/11/2014 9:50 AM  I have seen, examined the patient, and reviewed the above assessment and plan.  On exam, RRR. Changes to above are made where necessary. Venous dopplers reveal no DVT. Proceed with ILR implant for cryptogenic stroke protocol.  Co Sign: Hillis Range, MD 09/11/2014 12:47 PM

## 2014-09-12 ENCOUNTER — Ambulatory Visit
Admission: RE | Admit: 2014-09-12 | Discharge: 2014-09-12 | Disposition: A | Discharge status: Home / Self Care | Payer: BC Managed Care – PPO | Source: Ambulatory Visit | Attending: Neurology | Admitting: Neurology

## 2014-09-12 DIAGNOSIS — I63411 Cerebral infarction due to embolism of right middle cerebral artery: Secondary | ICD-10-CM

## 2014-09-12 MED ORDER — IOPAMIDOL (ISOVUE-370) INJECTION 76%
100.0000 mL | Freq: Once | INTRAVENOUS | Status: AC | PRN
  Administered 2014-09-12: 100 mL via INTRAVENOUS

## 2014-09-14 ENCOUNTER — Encounter (HOSPITAL_COMMUNITY): Payer: Self-pay | Admitting: Cardiology

## 2014-09-16 ENCOUNTER — Other Ambulatory Visit: Payer: Self-pay | Admitting: Neurology

## 2014-09-16 DIAGNOSIS — I63411 Cerebral infarction due to embolism of right middle cerebral artery: Secondary | ICD-10-CM

## 2014-09-16 MED ORDER — ATORVASTATIN CALCIUM 20 MG PO TABS: 20.0000 mg | ORAL_TABLET | Freq: Every day | ORAL | Status: DC

## 2014-09-20 ENCOUNTER — Ambulatory Visit (INDEPENDENT_AMBULATORY_CARE_PROVIDER_SITE_OTHER): Payer: BC Managed Care – PPO | Admitting: *Deleted

## 2014-09-20 DIAGNOSIS — I63411 Cerebral infarction due to embolism of right middle cerebral artery: Secondary | ICD-10-CM

## 2014-09-20 LAB — CUP PACEART INCLINIC DEVICE CHECK
MDC IDC SESS DTM: 20160908103555
MDC IDC SET ZONE DETECTION INTERVAL: 2000 ms
MDC IDC SET ZONE DETECTION INTERVAL: 330 ms
Zone Setting Detection Interval: 3000 ms

## 2014-09-20 NOTE — Progress Notes (Signed)
Wound check in clinic s/p ILR implant. Wound well healed without redness or edema. Pt with 0 tachy episodes; 0 brady episodes; 0 asystole; 0 symptom episodes; 0 AF episodes. Patient education completed including wound care. Plan to continue Carelink f/u QMO and f/u with JA prn.

## 2014-10-11 ENCOUNTER — Ambulatory Visit (INDEPENDENT_AMBULATORY_CARE_PROVIDER_SITE_OTHER): Payer: BC Managed Care – PPO | Admitting: *Deleted

## 2014-10-11 DIAGNOSIS — I63411 Cerebral infarction due to embolism of right middle cerebral artery: Secondary | ICD-10-CM

## 2014-10-12 NOTE — Progress Notes (Signed)
Loop recorder 

## 2014-10-15 ENCOUNTER — Encounter: Payer: Self-pay | Admitting: Internal Medicine

## 2014-10-15 LAB — CUP PACEART REMOTE DEVICE CHECK: MDC IDC SESS DTM: 20160929160601

## 2014-10-15 NOTE — Progress Notes (Signed)
Carelink summary report received. Battery status OK. Normal device function. No new symptom episodes, tachy episodes, brady, or pause episodes. No new AF episodes. Monthly summary reports and ROV w/ JA PRN. 

## 2014-11-01 ENCOUNTER — Encounter: Payer: Self-pay | Admitting: Internal Medicine

## 2014-11-12 ENCOUNTER — Ambulatory Visit (INDEPENDENT_AMBULATORY_CARE_PROVIDER_SITE_OTHER): Payer: BC Managed Care – PPO | Admitting: Neurology

## 2014-11-12 ENCOUNTER — Ambulatory Visit (INDEPENDENT_AMBULATORY_CARE_PROVIDER_SITE_OTHER): Payer: BC Managed Care – PPO | Admitting: *Deleted

## 2014-11-12 ENCOUNTER — Encounter: Payer: Self-pay | Admitting: Neurology

## 2014-11-12 VITALS — BP 115/77 | HR 65 | Ht 59.0 in | Wt 127.6 lb

## 2014-11-12 DIAGNOSIS — I63411 Cerebral infarction due to embolism of right middle cerebral artery: Secondary | ICD-10-CM | POA: Diagnosis not present

## 2014-11-12 DIAGNOSIS — E785 Hyperlipidemia, unspecified: Secondary | ICD-10-CM

## 2014-11-12 DIAGNOSIS — I679 Cerebrovascular disease, unspecified: Secondary | ICD-10-CM

## 2014-11-12 NOTE — Progress Notes (Signed)
NEUROLOGY CLINIC NEW PATIENT NOTE  NAME: Ryan Mitchell DOB: 09/10/1965 REFERRING PHYSICIAN: Deatra James, MD  I saw Ryan Mitchell as a new consult in the neurovascular clinic today regarding  Chief Complaint  Patient presents with  . Follow-up  .  History summary: Ryan Mitchell is a 49 y.o. male with PMH of HLD who presents as a new patient for a stroke.   Patient was admitted 07/13/2014 for transient episode of right facial droop, right lower extremity weakness and slurry speech during work. The symptoms last about 20 minutes and then resolved. In ED patient became asymmetric at that time. However, MRI was done showing small infarct in the right frontal cortex. MRA also showed right MCA M1/M2 junction high-grade stenosis. There are also a questionable left M2 moderate stenosis. Other stroke workup including carotid Doppler and 2-D echo, A1c and hypercoagulable workup were unremarkable. However LDL 97. He was put on aspirin and Plavix dural antiplatelet and Lipitor on discharge.   Follow up 09/03/14 - he has no recurrent strokelike symptoms, report compliant with medications. Plan to do TEE as outpatient, currently scheduled next week.  He denies any history of hypertension, diabetes, A. fib or heart palpitations. Family history including father had stroke at age of 20. He denies any smoking, alcohol or illicit drugs. 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  Interval History: During the interval time, he has been doing well. No recurrent stroke like symptoms. Had TEE as outpt which was unremarkable except PFO. Loop recorder placed and so far no afib episodes. CTA head and neck showed significant improvement of right MCA stenosis, suggesting an embolus at that time. He has completely dual antiplatelet course and now on plavix and lipitor.   Past Medical History  Diagnosis Date  . Stroke (HCC)   . Hyperlipidemia    Past Surgical History  Procedure Laterality Date  . No past  surgeries    . Ep implantable device N/A 09/11/2014    Procedure: Loop Recorder Insertion;  Surgeon: Hillis Range, MD;  Location: [facility] INVASIVE CV LAB;  Service: Cardiovascular;  Laterality: N/A;  . Tee without cardioversion N/A 09/11/2014    Procedure: TRANSESOPHAGEAL ECHOCARDIOGRAM (TEE);  Surgeon: Lars Masson, MD;  Location: [facility] ENDOSCOPY;  Service: Cardiovascular;  Laterality: N/A;   Family History  Problem Relation Age of Onset  . Stroke Father   . Hypertension Father    Current Outpatient Prescriptions  Medication Sig Dispense Refill  . atorvastatin (LIPITOR) 20 MG tablet Take 1 tablet (20 mg total) by mouth daily at 6 PM. 90 tablet 2  . clopidogrel (PLAVIX) 75 MG tablet Take 1 tablet (75 mg total) by mouth daily. 90 tablet 3   No current facility-administered medications for this visit.   No Known Allergies Social History   Social History  . Marital Status: Married    Spouse Name: N/A  . Number of Children: N/A  . Years of Education: N/A   Occupational History  . Not on file.   Social History Main Topics  . Smoking status: Never Smoker   . Smokeless tobacco: Not on file  . Alcohol Use: Yes     Comment: casual beer and wine on weekends  . Drug Use: Not on file  . Sexual Activity: Not on file   Other Topics Concern  . Not on file   Social History Narrative    Review of Systems Full 14 system review of systems performed and notable only for those listed, all  others are neg:  Constitutional:   Cardiovascular:  Ear/Nose/Throat:   Skin:  Eyes:   Respiratory:   Gastroitestinal:   Genitourinary:  Hematology/Lymphatic:   Endocrine:  Musculoskeletal:   Allergy/Immunology:   Neurological:   Psychiatric:  Sleep:    Physical Exam  Filed Vitals:   11/12/14 1407  BP: 115/77  Pulse: 65    General - Well nourished, well developed, in no apparent distress.  Ophthalmologic - Sharp disc margins OU.  Cardiovascular - Regular rate and rhythm with no  murmur. Carotid pulses were 2+ without bruits .   Neck - supple, no nuchal rigidity .  Mental Status -  Level of arousal and orientation to time, place, and person were intact. Language including expression, naming, repetition, comprehension, reading, and writing was assessed and found intact. Attention span and concentration were normal. Recent and remote memory were intact. Fund of Knowledge was assessed and was intact.  Cranial Nerves II - XII - II - Visual field intact OU. III, IV, VI - Extraocular movements intact. V - Facial sensation intact bilaterally. VII - Facial movement intact bilaterally. VIII - Hearing & vestibular intact bilaterally. X - Palate elevates symmetrically. XI - Chin turning & shoulder shrug intact bilaterally. XII - Tongue protrusion intact.  Motor Strength - The patient's strength was normal in all extremities and pronator drift was absent.  Bulk was normal and fasciculations were absent.   Motor Tone - Muscle tone was assessed at the neck and appendages and was normal.  Reflexes - The patient's reflexes were normal in all extremities and he had no pathological reflexes.  Sensory - Light touch, temperature/pinprick, vibration and proprioception, and Romberg testing were assessed and were normal.    Coordination - The patient had normal movements in the hands and feet with no ataxia or dysmetria.  Tremor was absent.  Gait and Station - The patient's transfers, posture, gait, station, and turns were observed as normal.   Imaging  I have personally reviewed the radiological images below and agree with the radiology interpretations.  Ct Head (brain) Wo Contrast 07/13/2014  No intracranial mass, hemorrhage, or focal gray - white compartment lesions/acute appearing infarct. There is rightward deviation of the nasal septum.   Mr Maxine GlennMra Head/brain Wo Cm 07/13/2014  1 cm acute infarction in the right frontal cortex near the vertex. Mild chronic  small-vessel change elsewhere within the cerebral hemispheric white matter. 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 More distal vessels do largely show reconstituted flow.  Carotid Doppler 1-39% internal carotid artery stenosis bilaterally. Vertebral arteries are patent with antegrade flow.  2D Echo EF 60-65%. No cardiac source of emboli identified.  TEE - - Left ventricle: Systolic function was normal. The estimated ejection fraction was in the range of 60% to 65%. Wall motion was normal; there were no regional wall motion abnormalities. - Aortic valve: Structurally normal valve. Trileaflet; normal thickness leaflets. There was no significant regurgitation. - Ascending aorta: The ascending aorta was normal in size. - Aortic arch: The aortic arch was normal in size. - Descending aorta: The descending aorta was normal in size. - Mitral valve: Structurally normal valve. There was no significant regurgitation. - Left atrium: The atrium was dilated. No evidence of thrombus in the atrial cavity or appendage. No evidence of thrombus in the atrial cavity or appendage. - Right ventricle: The cavity size was normal. Wall thickness was normal. Systolic function was normal. - Right atrium:  No evidence of thrombus in the atrial cavity or appendage. The appendage was morphologically a right appendage. - Atrial septum: There was a patent foramen ovale. - Tricuspid valve: Structurally normal valve. There was mild regurgitation. - Pulmonic valve: Structurally normal valve. - Pulmonary arteries: The main pulmonary artery was normal-sized. - Pericardium, extracardiac: There was no pericardial effusion. Impressions: - A PFO is present.  CTA head and neck -  1. Right MCA trifurcation stenosis noted on MRA 07/13/2014 is significantly improved, suggesting interval lysis of an embolus. Distal right M1 and proximal M2 stenoses are  now mild to moderate. 2. Cervical carotid atherosclerosis without stenosis. 3. Moderate left vertebral artery origin stenosis.  Lab Review Component     Latest Ref Rng 07/13/2014 07/14/2014  Cholesterol     0 - 200 mg/dL  119 (H)  Triglycerides     <150 mg/dL  147 (H)  HDL Cholesterol     >40 mg/dL  54  Total CHOL/HDL Ratio       4.0  VLDL     0 - 40 mg/dL  66 (H)  LDL (calc)     0 - 99 mg/dL  97  PTT Lupus Anticoagulant     0.0 - 50.0 sec  41.1  DRVVT     0.0 - 55.1 sec  37.3  Lupus Anticoag Interp       Comment:  Beta-2 Glyco I IgG     0 - 20 GPI IgG units  <9  Beta-2-Glycoprotein I IgM     0 - 32 GPI IgM units  <9  Beta-2-Glycoprotein I IgA     0 - 25 GPI IgA units  <9  Anticardiolipin IgG     0 - 14 GPL U/mL  <9  Anticardiolipin IgM     0 - 12 MPL U/mL  <9  Anticardiolipin IgA     0 - 11 APL U/mL  <9  Hemoglobin A1C     4.8 - 5.6 % 5.9 (H)   Mean Plasma Glucose      123   Recommendations-F5LEID:       Comment  Comment       Comment  Recommendations-PTGENE:       Comment  Additional Information       Comment  Antithrombin Activity     75 - 120 %  100  Protein C Activity     74 - 151 %  159 (H)  Protein C, Total     70 - 140 %  95  Protein S Activity     60 - 145 %  98  Protein S Ag, Total     58 - 150 %  116  Homocysteine     0.0 - 15.0 umol/L  10.9   Assessment and Plan:   In summary, Ryan Mitchell is a 49 y.o. male with PMH of HLD was admitted on 07/13/2014 for transient episode of slurry speech, left facial droop and LUE weakness.  Symptoms resolved in 20 minutes. MRI showed small infarct in the right frontal cortex. MRA also showed right MCA M1/M2 junction high-grade stenosis. There are also a questionable left M2 moderate stenosis. Other stroke workup including carotid Doppler and 2-D echo, A1c and hypercoagulable workup were unremarkable except LDL 97. He was put on aspirin and Plavix dural antiplatelet and Lipitor on discharge. TEE unremarkable except  PFO. CTA head and neck showed significant improvement of right MCA stenosis, suggesting an embolus initially. Still has mild distal right M1  and proximal M2 stenosis. Completed dual antiplatelet course and now on plavix and lipitor. Loop recorder so far no afib episodes.  - continue plavix and lipitor for stroke prevention. - check BP at home - Follow up with your primary care physician for stroke risk factor modification. Recommend maintain blood pressure goal <130/80, diabetes with hemoglobin A1c goal below 6.5% and lipids with LDL cholesterol goal below 70 mg/dL. - continue loop recorder monitoring. - follow up in 6 months.  No orders of the defined types were placed in this encounter.    No orders of the defined types were placed in this encounter.    Patient Instructions  - continue plavix and lipitor for stroke prevention. - check BP at home - Follow up with your primary care physician for stroke risk factor modification. Recommend maintain blood pressure goal <130/80, diabetes with hemoglobin A1c goal below 6.5% and lipids with LDL cholesterol goal below 70 mg/dL. - continue loop recorder monitoring. - follow up in 6 months.    Marvel Plan, MD PhD [facility] [address] [place], [place] [address] [phone]

## 2014-11-12 NOTE — Patient Instructions (Signed)
-   continue plavix and lipitor for stroke prevention. - check BP at home - Follow up with your primary care physician for stroke risk factor modification. Recommend maintain blood pressure goal <130/80, diabetes with hemoglobin A1c goal below 6.5% and lipids with LDL cholesterol goal below 70 mg/dL. - continue loop recorder monitoring. - follow up in 6 months.

## 2014-11-15 NOTE — Progress Notes (Signed)
Loop recorder 

## 2014-12-08 LAB — CUP PACEART REMOTE DEVICE CHECK: MDC IDC SESS DTM: 20161029163536

## 2014-12-08 NOTE — Progress Notes (Signed)
Carelink summary report received. Battery status OK. Normal device function. No new symptom episodes, tachy episodes, brady, or pause episodes. No new AF episodes. Monthly summary reports and ROV with JA PRN. 

## 2014-12-10 ENCOUNTER — Ambulatory Visit (INDEPENDENT_AMBULATORY_CARE_PROVIDER_SITE_OTHER): Payer: BC Managed Care – PPO | Admitting: *Deleted

## 2014-12-10 DIAGNOSIS — I639 Cerebral infarction, unspecified: Secondary | ICD-10-CM | POA: Diagnosis not present

## 2014-12-12 NOTE — Progress Notes (Signed)
LOOP RECORDER  

## 2015-01-09 ENCOUNTER — Ambulatory Visit (INDEPENDENT_AMBULATORY_CARE_PROVIDER_SITE_OTHER): Payer: BC Managed Care – PPO | Admitting: *Deleted

## 2015-01-09 DIAGNOSIS — I639 Cerebral infarction, unspecified: Secondary | ICD-10-CM | POA: Diagnosis not present

## 2015-01-09 NOTE — Progress Notes (Signed)
Carelink Summary Report / Loop Recorder 

## 2015-01-20 LAB — CUP PACEART REMOTE DEVICE CHECK: MDC IDC SESS DTM: 20161128170610

## 2015-02-08 ENCOUNTER — Ambulatory Visit (INDEPENDENT_AMBULATORY_CARE_PROVIDER_SITE_OTHER): Payer: BC Managed Care – PPO | Admitting: *Deleted

## 2015-02-08 DIAGNOSIS — I639 Cerebral infarction, unspecified: Secondary | ICD-10-CM

## 2015-02-08 NOTE — Progress Notes (Signed)
Carelink Summary Report / Loop Recorder 

## 2015-02-16 LAB — CUP PACEART REMOTE DEVICE CHECK: Date Time Interrogation Session: 20161228170846

## 2015-03-11 ENCOUNTER — Ambulatory Visit (INDEPENDENT_AMBULATORY_CARE_PROVIDER_SITE_OTHER): Payer: BC Managed Care – PPO | Admitting: *Deleted

## 2015-03-11 DIAGNOSIS — I639 Cerebral infarction, unspecified: Secondary | ICD-10-CM | POA: Diagnosis not present

## 2015-03-11 NOTE — Progress Notes (Signed)
Carelink Summary Report / Loop Recorder 

## 2015-03-25 NOTE — Progress Notes (Signed)
Carelink summary report received. Battery status OK. Normal device function. No new symptom episodes, tachy episodes, brady, or pause episodes. No new AF episodes. Monthly summary reports and ROV/PRN 

## 2015-03-30 LAB — CUP PACEART REMOTE DEVICE CHECK: MDC IDC SESS DTM: 20170127170713

## 2015-03-30 NOTE — Progress Notes (Signed)
Carelink summary report received. Battery status OK. Normal device function. No new symptom episodes, tachy episodes, brady, or pause episodes. No new AF episodes. Monthly summary reports and ROV/PRN 

## 2015-03-30 NOTE — Progress Notes (Deleted)
[id number] 

## 2015-04-09 ENCOUNTER — Ambulatory Visit (INDEPENDENT_AMBULATORY_CARE_PROVIDER_SITE_OTHER): Payer: BC Managed Care – PPO | Admitting: *Deleted

## 2015-04-09 DIAGNOSIS — I639 Cerebral infarction, unspecified: Secondary | ICD-10-CM

## 2015-04-09 NOTE — Progress Notes (Signed)
Carelink Summary Report / Loop Recorder 

## 2015-04-10 LAB — CUP PACEART REMOTE DEVICE CHECK: MDC IDC SESS DTM: 20170328180612

## 2015-05-09 ENCOUNTER — Ambulatory Visit (INDEPENDENT_AMBULATORY_CARE_PROVIDER_SITE_OTHER): Payer: BC Managed Care – PPO | Admitting: *Deleted

## 2015-05-09 DIAGNOSIS — I639 Cerebral infarction, unspecified: Secondary | ICD-10-CM

## 2015-05-10 NOTE — Progress Notes (Signed)
Carelink Summary Report / Loop Recorder 

## 2015-05-16 ENCOUNTER — Encounter: Payer: Self-pay | Admitting: Neurology

## 2015-05-16 ENCOUNTER — Ambulatory Visit (INDEPENDENT_AMBULATORY_CARE_PROVIDER_SITE_OTHER): Payer: BC Managed Care – PPO | Admitting: Neurology

## 2015-05-16 VITALS — BP 127/75 | HR 58 | Ht 59.0 in | Wt 126.0 lb

## 2015-05-16 DIAGNOSIS — I679 Cerebrovascular disease, unspecified: Secondary | ICD-10-CM

## 2015-05-16 DIAGNOSIS — E785 Hyperlipidemia, unspecified: Secondary | ICD-10-CM | POA: Diagnosis not present

## 2015-05-16 DIAGNOSIS — I63411 Cerebral infarction due to embolism of right middle cerebral artery: Secondary | ICD-10-CM | POA: Diagnosis not present

## 2015-05-16 NOTE — Progress Notes (Signed)
NEUROLOGY CLINIC NEW PATIENT NOTE  NAME: Ryan Mitchell DOB: 1965/08/07 REFERRING PHYSICIAN: Deatra JamesSun, Vyvyan, MD  I saw Ryan Mitchell Ryan RochGu as a new consult in the neurovascular clinic today regarding  Chief Complaint  Patient presents with  . Follow-up    Cerebral infarction due to embolism of right middle cerebral artery (HCC)   .  History summary: Ryan Mitchell is a 50 y.o. male with PMH of HLD who presents as a new patient for a stroke.   Patient was admitted 07/13/2014 for transient episode of right facial droop, right lower extremity weakness and slurry speech during work. The symptoms last about 20 minutes and then resolved. In ED patient became asymmetric at that time. However, MRI was done showing small infarct in the right frontal cortex. MRA also showed right MCA M1/M2 junction high-grade stenosis. There are also a questionable left M2 moderate stenosis. Other stroke workup including carotid Doppler and 2-D echo, A1c and hypercoagulable workup were unremarkable. However LDL 97. He was put on aspirin and Plavix dural antiplatelet and Lipitor on discharge.   Follow up 09/03/14 - he has no recurrent strokelike symptoms, report compliant with medications. Plan to do TEE as outpatient, currently scheduled next week.  He denies any history of hypertension, diabetes, A. fib or heart palpitations. Family history including father had stroke at age of 49. He denies any smoking, alcohol or illicit drugs. 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  Follow up 11/12/14 - he has been doing well. No recurrent stroke like symptoms. Had TEE as outpt which was unremarkable except PFO. Loop recorder placed and so far no afib episodes. CTA head and neck showed significant improvement of right MCA stenosis, suggesting an embolus at that time. He has completely dual antiplatelet course and now on plavix and lipitor.   Interval History: During the interval time, pt has been doing well. Loop recorder no  afib so far. BP today 127/75. Stated that his recent lab from PCP office were all normal. Otherwise, no complains.  Past Medical History  Diagnosis Date  . Stroke (HCC)   . Hyperlipidemia    Past Surgical History  Procedure Laterality Date  . No past surgeries    . Ep implantable device N/A 09/11/2014    Procedure: Loop Recorder Insertion;  Surgeon: Hillis RangeJames Allred, MD;  Location: [facility];  Service: Cardiovascular;  Laterality: N/A;  . Tee without cardioversion N/A 09/11/2014    Procedure: TRANSESOPHAGEAL ECHOCARDIOGRAM (TEE);  Surgeon: Lars MassonKatarina H Nelson, MD;  [facility];  Service: Cardiovascular;  Laterality: N/A;  . Loop recorder implant     Family History  Problem Relation Age of Onset  . Stroke Father   . Hypertension Father    Current Outpatient Prescriptions  Medication Sig Dispense Refill  . atorvastatin (LIPITOR) 20 MG tablet Take 1 tablet (20 mg total) by mouth daily at 6 PM. 90 tablet 2  . clopidogrel (PLAVIX) 75 MG tablet Take 1 tablet (75 mg total) by mouth daily. 90 tablet 3   No current facility-administered medications for this visit.   No Known Allergies Social History   Social History  . Marital Status: Married    Spouse Name: N/A  . Number of Children: N/A  . Years of Education: N/A   Occupational History  . Not on file.   Social History Main Topics  . Smoking status: Never Smoker   . Smokeless tobacco: Not on file  . Alcohol Use: Yes     Comment:  casual beer and wine on weekends  . Drug Use: Not on file  . Sexual Activity: Not on file   Other Topics Concern  . Not on file   Social History Narrative    Review of Systems Full 14 system review of systems performed and notable only for those listed, all others are neg:  Constitutional:   Cardiovascular:  Ear/Nose/Throat:   Skin:  Eyes:   Respiratory:   Gastroitestinal:   Genitourinary:  Hematology/Lymphatic:   Endocrine:  Musculoskeletal:   Allergy/Immunology:     Neurological:   Psychiatric:  Sleep:    Physical Exam  Filed Vitals:   05/16/15 1014  BP: 127/75  Pulse: 58    General - Well nourished, well developed, in no apparent distress.  Ophthalmologic - Sharp disc margins OU.  Cardiovascular - Regular rate and rhythm with no murmur. Carotid pulses were 2+ without bruits .   Neck - supple, no nuchal rigidity .  Mental Status -  Level of arousal and orientation to time, place, and person were intact. Language including expression, naming, repetition, comprehension, reading, and writing was assessed and found intact. Attention span and concentration were normal. Recent and remote memory were intact. Fund of Knowledge was assessed and was intact.  Cranial Nerves II - XII - II - Visual field intact OU. III, IV, VI - Extraocular movements intact. V - Facial sensation intact bilaterally. VII - Facial movement intact bilaterally. VIII - Hearing & vestibular intact bilaterally. X - Palate elevates symmetrically. XI - Chin turning & shoulder shrug intact bilaterally. XII - Tongue protrusion intact.  Motor Strength - The patient's strength was normal in all extremities and pronator drift was absent.  Bulk was normal and fasciculations were absent.   Motor Tone - Muscle tone was assessed at the neck and appendages and was normal.  Reflexes - The patient's reflexes were normal in all extremities and he had no pathological reflexes.  Sensory - Light touch, temperature/pinprick, vibration and proprioception, and Romberg testing were assessed and were normal.    Coordination - The patient had normal movements in the hands and feet with no ataxia or dysmetria.  Tremor was absent.  Gait and Station - The patient's transfers, posture, gait, station, and turns were observed as normal.   Imaging  I have personally reviewed the radiological images below and agree with the radiology interpretations.  Ct Head (brain) Wo Contrast 07/13/2014   No intracranial mass, hemorrhage, or focal gray - white compartment lesions/acute appearing infarct. There is rightward deviation of the nasal septum.   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 Mild chronic small-vessel change elsewhere within the cerebral hemispheric white matter. 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 More distal vessels do largely show reconstituted flow.  Carotid Doppler 1-39% internal carotid artery stenosis bilaterally. Vertebral arteries are patent with antegrade flow.  2D Echo EF 60-65%. No cardiac source of emboli identified.  TEE - - Left ventricle: Systolic function was normal. The estimated ejection fraction was in the range of 60% to 65%. Wall motion was normal; there were no regional wall motion abnormalities. - Aortic valve: Structurally normal valve. Trileaflet; normal thickness leaflets. There was no significant regurgitation. - Ascending aorta: The ascending aorta was normal in size. - Aortic arch: The aortic arch was normal in size. - Descending aorta: The descending aorta was normal in size. -  Mitral valve: Structurally normal valve. There was no significant regurgitation. - Left atrium: The atrium was dilated. No evidence of thrombus in the atrial cavity or appendage. No evidence of thrombus in the atrial cavity or appendage. - Right ventricle: The cavity size was normal. Wall thickness was normal. Systolic function was normal. - Right atrium: No evidence of thrombus in the atrial cavity or appendage. The appendage was morphologically a right appendage. - Atrial septum: There was a patent foramen ovale. - Tricuspid valve: Structurally normal valve. There was mild regurgitation. - Pulmonic valve: Structurally normal valve. - Pulmonary arteries: The main pulmonary artery was normal-sized. - Pericardium,  extracardiac: There was no pericardial effusion. Impressions: - A PFO is present.  CTA head and neck -  1. Right MCA trifurcation stenosis noted on MRA 07/13/2014 is significantly improved, suggesting interval lysis of an embolus. Distal right M1 and proximal M2 stenoses are now mild to moderate. 2. Cervical carotid atherosclerosis without stenosis. 3. Moderate left vertebral artery origin stenosis.  Lab Review Component     Latest Ref Rng 07/13/2014 07/14/2014  Cholesterol     0 - 200 mg/dL  161 (H)  Triglycerides     <150 mg/dL  096 (H)  HDL Cholesterol     >40 mg/dL  54  Total CHOL/HDL Ratio       4.0  VLDL     0 - 40 mg/dL  66 (H)  LDL (calc)     0 - 99 mg/dL  97  PTT Lupus Anticoagulant     0.0 - 50.0 sec  41.1  DRVVT     0.0 - 55.1 sec  37.3  Lupus Anticoag Interp       Comment:  Beta-2 Glyco I IgG     0 - 20 GPI IgG units  <9  Beta-2-Glycoprotein I IgM     0 - 32 GPI IgM units  <9  Beta-2-Glycoprotein I IgA     0 - 25 GPI IgA units  <9  Anticardiolipin IgG     0 - 14 GPL U/mL  <9  Anticardiolipin IgM     0 - 12 MPL U/mL  <9  Anticardiolipin IgA     0 - 11 APL U/mL  <9  Hemoglobin A1C     4.8 - 5.6 % 5.9 (H)   Mean Plasma Glucose      123   Recommendations-F5LEID:       Comment  Comment       Comment  Recommendations-PTGENE:       Comment  Additional Information       Comment  Antithrombin Activity     75 - 120 %  100  Protein C Activity     74 - 151 %  159 (H)  Protein C, Total     70 - 140 %  95  Protein S Activity     60 - 145 %  98  Protein S Ag, Total     58 - 150 %  116  Homocysteine     0.0 - 15.0 umol/L  10.9   Assessment:   In summary, Finnian Husted is a 50 y.o. male with PMH of HLD was admitted on 07/13/2014 for transient episode of slurry speech, left facial droop and LUE weakness.  Symptoms resolved in 20 minutes. MRI showed small infarct in the right frontal cortex. MRA also showed right MCA M1/M2 junction high-grade stenosis. There  are also a questionable left M2 moderate stenosis. Other stroke  workup including carotid Doppler and 2-D echo, A1c and hypercoagulable workup were unremarkable except LDL 97. He was put on aspirin and Plavix dural antiplatelet and Lipitor on discharge. TEE unremarkable except PFO. CTA head and neck showed significant improvement of right MCA stenosis, suggesting an embolus initially. Still has mild distal right M1 and proximal M2 stenosis. Completed dual antiplatelet course and now on plavix and lipitor. Loop recorder so far no afib episodes.  Plan: - continue plavix and lipitor for stroke prevention. - check BP at home - Follow up with your primary care physician for stroke risk factor modification. Recommend maintain blood pressure goal <130/80, diabetes with hemoglobin A1c goal below 6.5% and lipids with LDL cholesterol goal below 70 mg/dL. - continue loop recorder monitoring - follow up as needed.  No orders of the defined types were placed in this encounter.    No orders of the defined types were placed in this encounter.    Patient Instructions  - continue plavix and lipitor for stroke prevention. - check BP at home - Follow up with your primary care physician for stroke risk factor modification. Recommend maintain blood pressure goal <130/80, diabetes with hemoglobin A1c goal below 6.5% and lipids with LDL cholesterol goal below 70 mg/dL. - continue loop recorder monitoring - follow up as needed.    Marvel Plan, MD PhD [facility] [address], [address] [place], [place] [address] [phone]

## 2015-05-16 NOTE — Patient Instructions (Signed)
-   continue plavix and lipitor for stroke prevention. - check BP at home - Follow up with your primary care physician for stroke risk factor modification. Recommend maintain blood pressure goal <130/80, diabetes with hemoglobin A1c goal below 6.5% and lipids with LDL cholesterol goal below 70 mg/dL. - continue loop recorder monitoring - follow up as needed.

## 2015-06-11 ENCOUNTER — Ambulatory Visit (INDEPENDENT_AMBULATORY_CARE_PROVIDER_SITE_OTHER): Payer: BC Managed Care – PPO | Admitting: *Deleted

## 2015-06-11 DIAGNOSIS — I639 Cerebral infarction, unspecified: Secondary | ICD-10-CM

## 2015-06-12 NOTE — Progress Notes (Signed)
Carelink Summary Report / Loop Recorder 

## 2015-06-14 NOTE — Progress Notes (Signed)
Carelink summary report received. Battery status OK. Normal device function. No new symptom episodes, tachy episodes, brady, or pause episodes. No new AF episodes. Monthly summary reports and ROV/PRN 

## 2015-06-20 LAB — CUP PACEART REMOTE DEVICE CHECK: MDC IDC SESS DTM: 20170427183624

## 2015-06-21 LAB — CUP PACEART REMOTE DEVICE CHECK: Date Time Interrogation Session: 20170226173703

## 2015-07-03 ENCOUNTER — Other Ambulatory Visit: Payer: Self-pay | Admitting: Neurology

## 2015-07-08 ENCOUNTER — Ambulatory Visit (INDEPENDENT_AMBULATORY_CARE_PROVIDER_SITE_OTHER): Payer: BC Managed Care – PPO | Admitting: *Deleted

## 2015-07-08 DIAGNOSIS — I639 Cerebral infarction, unspecified: Secondary | ICD-10-CM | POA: Diagnosis not present

## 2015-07-08 NOTE — Progress Notes (Signed)
[facility] Summary Report / Loop Record 

## 2015-07-18 LAB — CUP PACEART REMOTE DEVICE CHECK
Date Time Interrogation Session: 20170527190650
Date Time Interrogation Session: 20170626193836

## 2015-08-07 ENCOUNTER — Ambulatory Visit (INDEPENDENT_AMBULATORY_CARE_PROVIDER_SITE_OTHER): Payer: BC Managed Care – PPO | Admitting: *Deleted

## 2015-08-07 DIAGNOSIS — I639 Cerebral infarction, unspecified: Secondary | ICD-10-CM

## 2015-08-08 NOTE — Progress Notes (Signed)
Carelink Summary Report / Loop Recorder 

## 2015-08-23 LAB — CUP PACEART REMOTE DEVICE CHECK: Date Time Interrogation Session: 20170726194025

## 2015-09-06 ENCOUNTER — Ambulatory Visit (INDEPENDENT_AMBULATORY_CARE_PROVIDER_SITE_OTHER): Payer: BC Managed Care – PPO | Admitting: *Deleted

## 2015-09-06 DIAGNOSIS — I639 Cerebral infarction, unspecified: Secondary | ICD-10-CM

## 2015-09-10 NOTE — Progress Notes (Signed)
Carelink Summary Report / Loop Recorder 

## 2015-10-02 ENCOUNTER — Other Ambulatory Visit: Payer: Self-pay | Admitting: Neurology

## 2015-10-05 LAB — CUP PACEART REMOTE DEVICE CHECK: Date Time Interrogation Session: 20170825193608

## 2015-10-05 NOTE — Progress Notes (Signed)
Carelink summary report received. Battery status OK. Normal device function. No new symptom episodes, tachy episodes, brady, or pause episodes. No new AF episodes. Monthly summary reports and ROV/PRN 

## 2015-10-07 ENCOUNTER — Ambulatory Visit (INDEPENDENT_AMBULATORY_CARE_PROVIDER_SITE_OTHER): Payer: BC Managed Care – PPO | Admitting: *Deleted

## 2015-10-07 DIAGNOSIS — I639 Cerebral infarction, unspecified: Secondary | ICD-10-CM | POA: Diagnosis not present

## 2015-10-07 NOTE — Progress Notes (Signed)
Carelink Summary Report / Loop Recorder 

## 2015-11-05 ENCOUNTER — Ambulatory Visit (INDEPENDENT_AMBULATORY_CARE_PROVIDER_SITE_OTHER): Payer: BC Managed Care – PPO | Admitting: *Deleted

## 2015-11-05 DIAGNOSIS — I639 Cerebral infarction, unspecified: Secondary | ICD-10-CM

## 2015-11-06 NOTE — Progress Notes (Signed)
Carelink Summary Report / Loop Recorder 

## 2015-11-21 LAB — CUP PACEART REMOTE DEVICE CHECK
Date Time Interrogation Session: 20170924200618
MDC IDC PG IMPLANT DT: 20160830

## 2015-11-21 NOTE — Progress Notes (Signed)
Carelink summary report received. Battery status OK. Normal device function. No new symptom episodes, tachy episodes, brady, or pause episodes. No new AF episodes. Monthly summary reports and ROV/PRN 

## 2015-12-03 LAB — CUP PACEART REMOTE DEVICE CHECK
Implantable Pulse Generator Implant Date: 20160830
MDC IDC SESS DTM: 20171024204314

## 2015-12-03 NOTE — Progress Notes (Signed)
Carelink summary report received. Battery status OK. Normal device function. No new symptom episodes, tachy episodes, brady, or pause episodes. No new AF episodes. Monthly summary reports and ROV/PRN 

## 2015-12-09 ENCOUNTER — Ambulatory Visit (INDEPENDENT_AMBULATORY_CARE_PROVIDER_SITE_OTHER): Payer: BC Managed Care – PPO | Admitting: *Deleted

## 2015-12-09 DIAGNOSIS — I639 Cerebral infarction, unspecified: Secondary | ICD-10-CM

## 2015-12-10 NOTE — Progress Notes (Signed)
Carelink Summary Report / Loop Recorder 

## 2016-01-07 ENCOUNTER — Ambulatory Visit (INDEPENDENT_AMBULATORY_CARE_PROVIDER_SITE_OTHER): Payer: BC Managed Care – PPO | Admitting: *Deleted

## 2016-01-07 DIAGNOSIS — I639 Cerebral infarction, unspecified: Secondary | ICD-10-CM

## 2016-01-07 NOTE — Progress Notes (Signed)
Carelink Summary Report / Loop Recorder 

## 2016-01-18 LAB — CUP PACEART REMOTE DEVICE CHECK
Date Time Interrogation Session: 20171123213852
MDC IDC PG IMPLANT DT: 20160830

## 2016-01-18 NOTE — Progress Notes (Signed)
Carelink summary report received. Battery status OK. Normal device function. No new symptom episodes, tachy episodes, brady, or pause episodes. No new AF episodes. Monthly summary reports and ROV/PRN 

## 2016-02-03 ENCOUNTER — Ambulatory Visit (INDEPENDENT_AMBULATORY_CARE_PROVIDER_SITE_OTHER): Payer: BC Managed Care – PPO | Admitting: *Deleted

## 2016-02-03 DIAGNOSIS — I63411 Cerebral infarction due to embolism of right middle cerebral artery: Secondary | ICD-10-CM | POA: Diagnosis not present

## 2016-02-04 NOTE — Progress Notes (Signed)
Carelink Summary Report / Loop Recorder 

## 2016-02-24 LAB — CUP PACEART REMOTE DEVICE CHECK
MDC IDC PG IMPLANT DT: 20160830
MDC IDC SESS DTM: 20171225165614

## 2016-02-24 NOTE — Progress Notes (Signed)
Carelink summary report received. Battery status OK. Normal device function. No new symptom episodes, tachy episodes, brady, or pause episodes. No new AF episodes. Monthly summary reports and ROV/PRN 

## 2016-03-04 ENCOUNTER — Ambulatory Visit (INDEPENDENT_AMBULATORY_CARE_PROVIDER_SITE_OTHER): Payer: BC Managed Care – PPO | Admitting: *Deleted

## 2016-03-04 DIAGNOSIS — I63411 Cerebral infarction due to embolism of right middle cerebral artery: Secondary | ICD-10-CM

## 2016-03-05 NOTE — Progress Notes (Signed)
Carelink Summary Report / Loop Recorder 

## 2016-03-07 LAB — CUP PACEART REMOTE DEVICE CHECK
Date Time Interrogation Session: 20180122231158
Implantable Pulse Generator Implant Date: 20160830

## 2016-03-20 IMAGING — CT CT HEAD W/O CM
2 series · 16 of 30 positions shown, 20 images · non-contrast
Comparison: None.

CLINICAL DATA: Right-sided facial droop and slurred speech,
transient

EXAM:
CT HEAD WITHOUT CONTRAST
TECHNIQUE: Contiguous axial images were obtained from the base of the skull
through the vertex without intravenous contrast.

[Series 201: head w/o, idose (1) · axial · non-contrast · 0.49mm/px · z∈[+84,+209]mm · 13 of 31 slices shown, 17 images]
[im 3/31  brain]
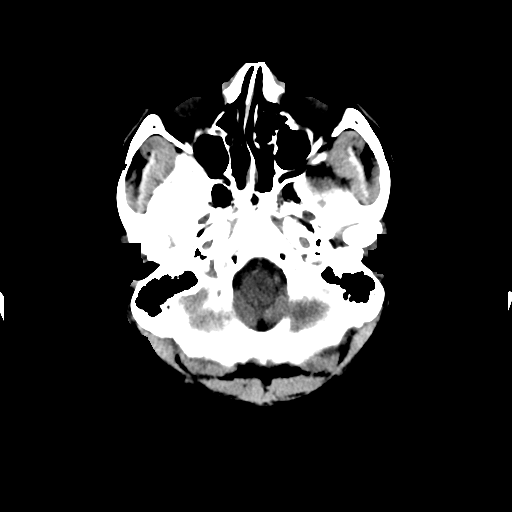
[im 3/31  bone]
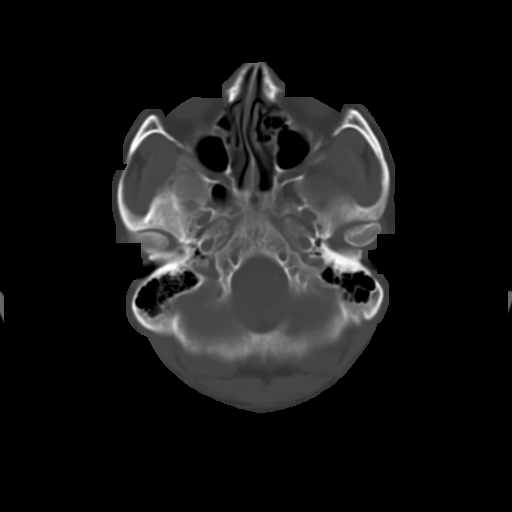
[im 5/31  brain]
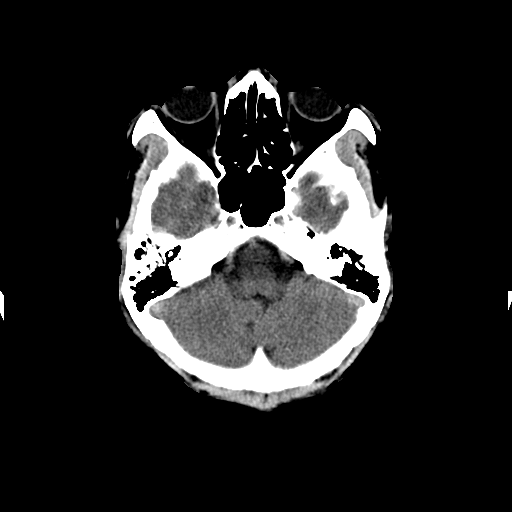
[im 7/31  brain]
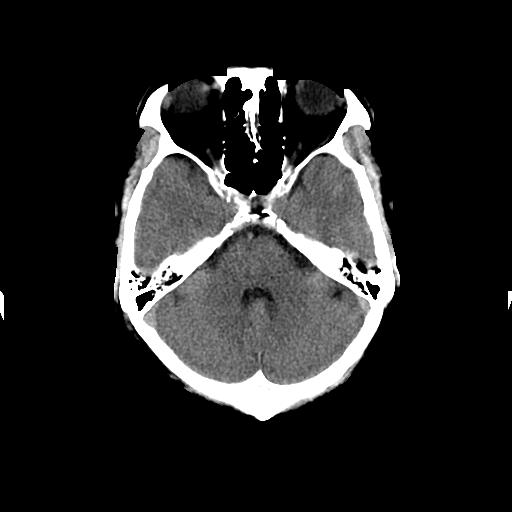
[im 9/31  brain]
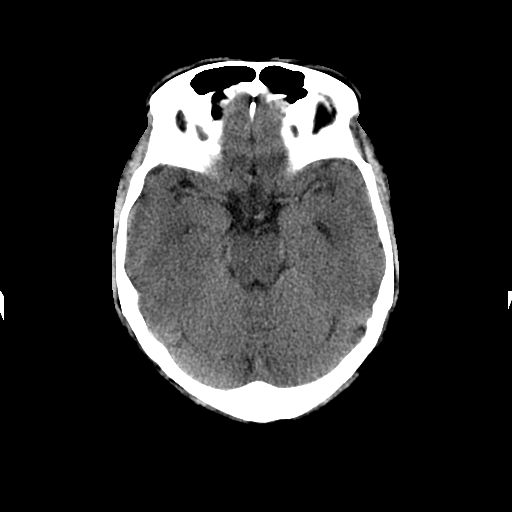
[im 11/31  brain]
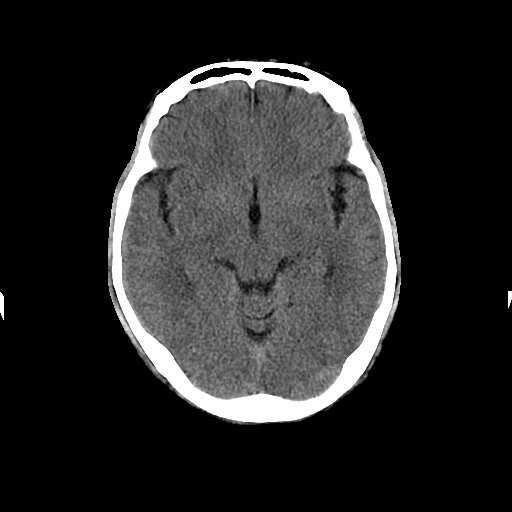
[im 11/31  bone]
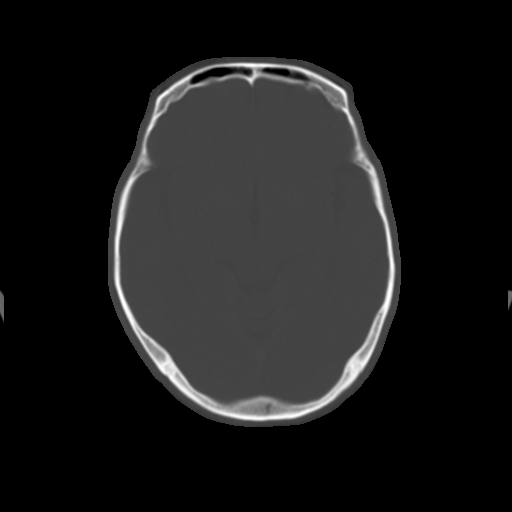
[im 13/31  brain]
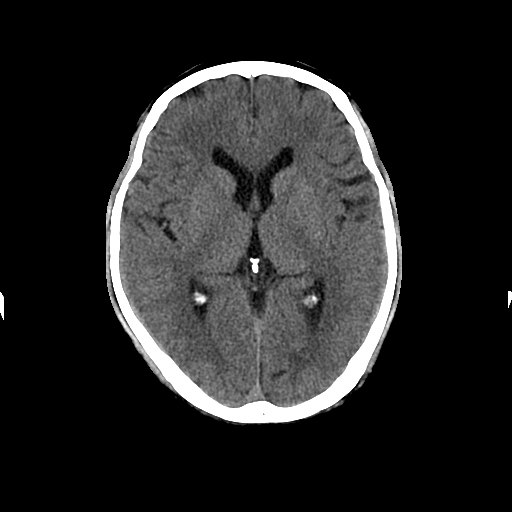
[im 16/31  brain]
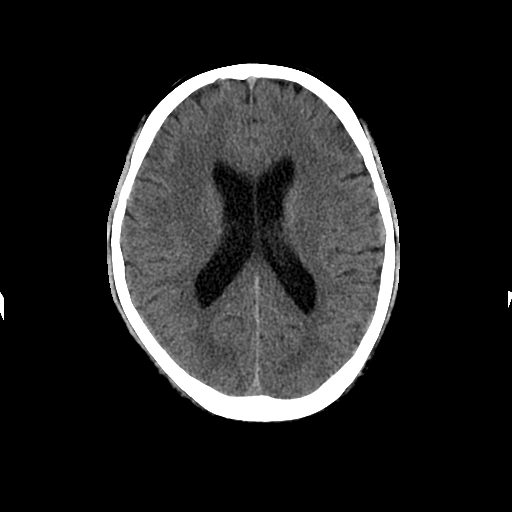
[im 18/31  brain]
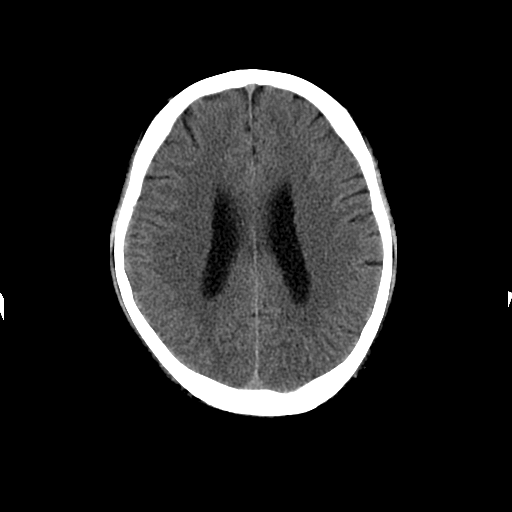
[im 20/31  brain]
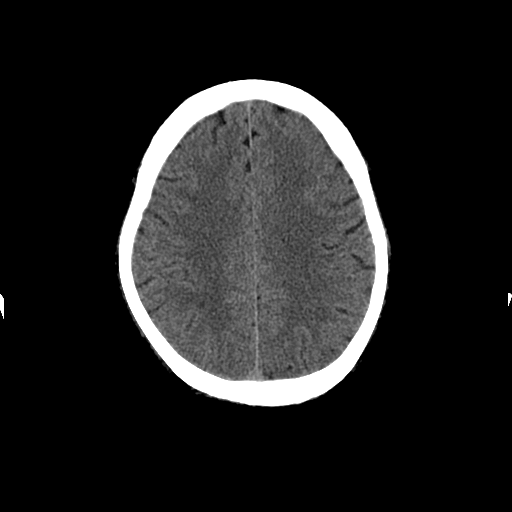
[im 20/31  bone]
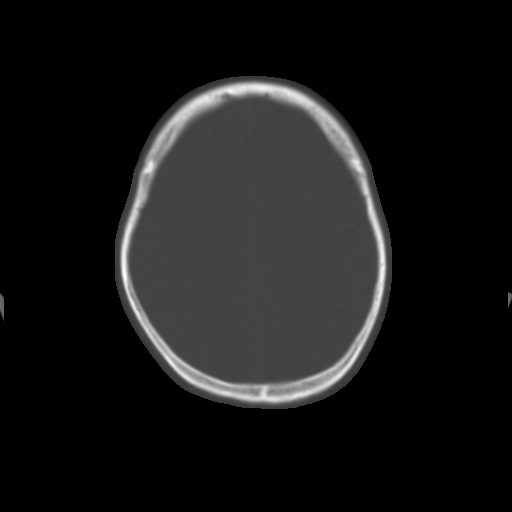
[im 22/31  brain]
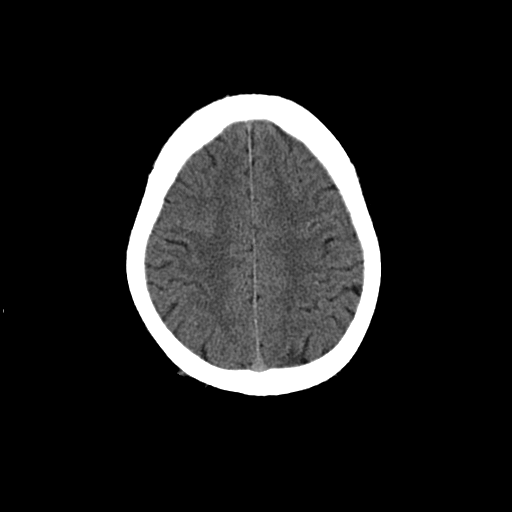
[im 24/31  brain]
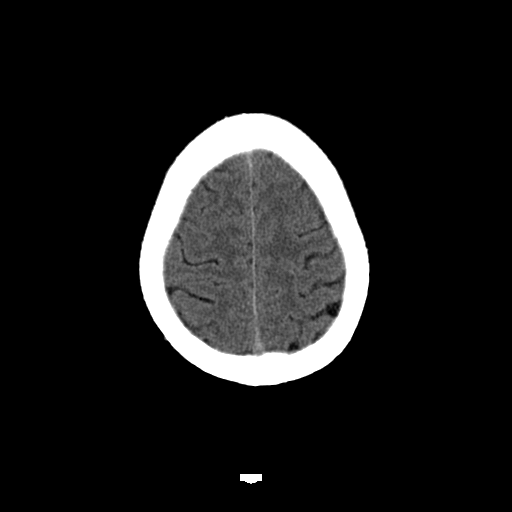
[im 26/31  brain]
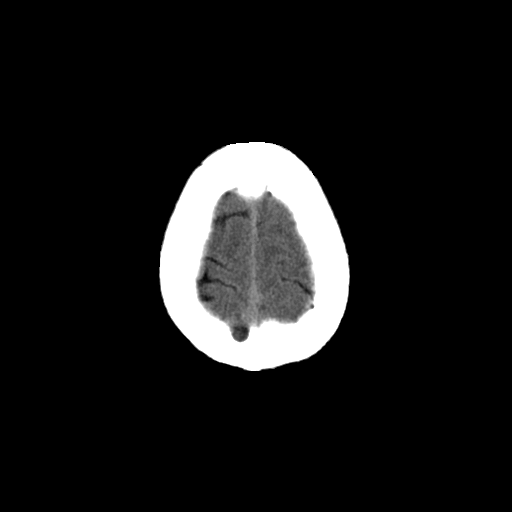
[im 28/31  brain]
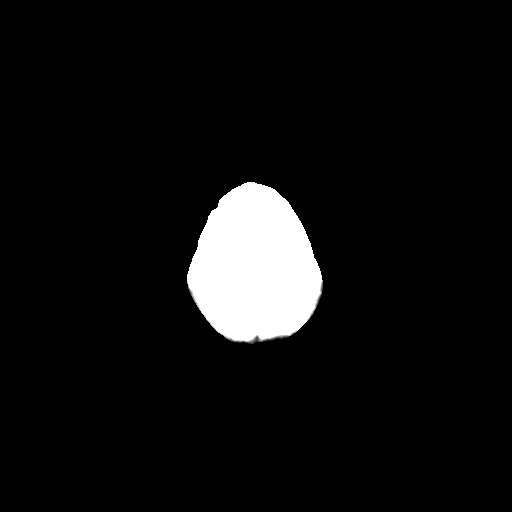
[im 28/31  bone]
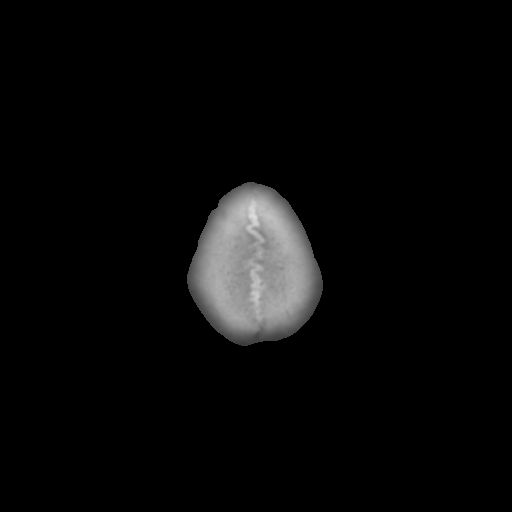

[Series 202: head w/o bone, idose (1) · axial · non-contrast · 0.49mm/px · z∈[+84,+124]mm · 3 of 31 slices shown]
[im 3/31  bone]
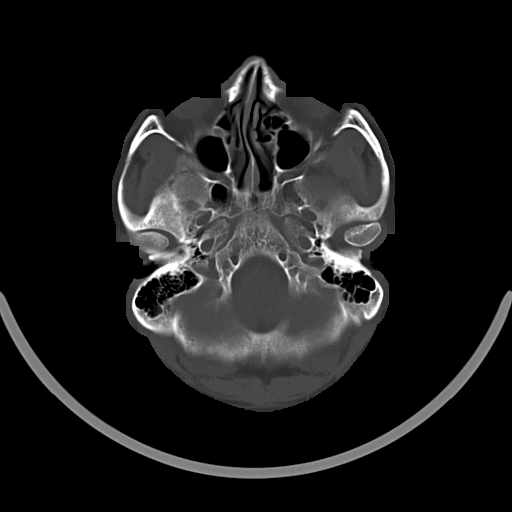
[im 7/31  bone]
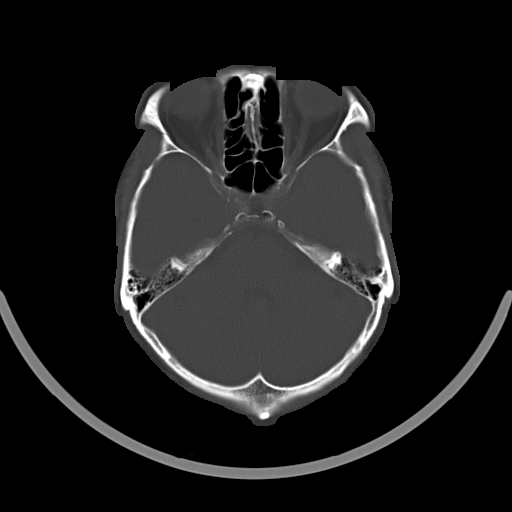
[im 11/31  bone]
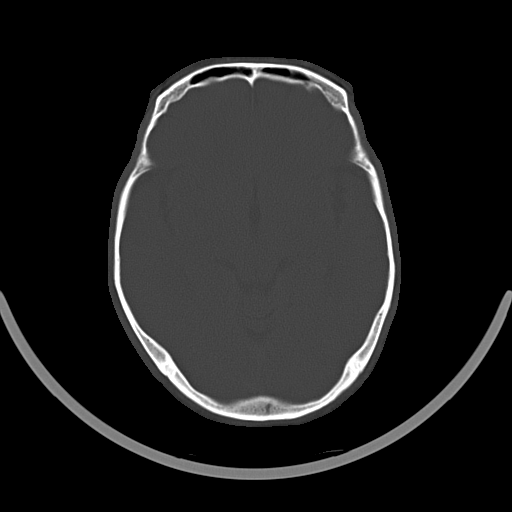

[16 of 30 positions shown; findings below may reference images not displayed]

FINDINGS: The ventricles are normal in size and configuration. There is no
intracranial mass, hemorrhage, extra-axial fluid collection, or
midline shift. Gray-white compartments are normal. There is no
demonstrable acute infarct. The bony calvarium appears intact. The
mastoid air cells are clear. There is rightward deviation of the
nasal septum.
IMPRESSION: No intracranial mass, hemorrhage, or focal gray - white compartment
lesions/acute appearing infarct. There is rightward deviation of the
nasal septum.

## 2016-03-20 IMAGING — MR MR MRA HEAD W/O CM
9 of 11 series · 32 of 48 positions shown · non-contrast
Comparison: Head CT same day

CLINICAL DATA: Transient ischemic attack. Right facial droop,
slurred speech in right lower extremity weakness which began
approximately 6 hr ago. Speech disturbance as well.

EXAM:
MRI HEAD WITHOUT CONTRAST
MRA HEAD WITHOUT CONTRAST
TECHNIQUE: Multiplanar, multiecho pulse sequences of the brain and surrounding
structures were obtained without intravenous contrast. Angiographic
images of the head were obtained using MRA technique without
contrast.

[Series 3: DWI · axial · 3.0mm · 1.09mm/px · z∈[-68,+76]mm · 7 of 98 slices shown (1 of 4)]
[im 1/98]
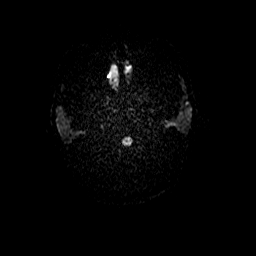
[im 17/98]
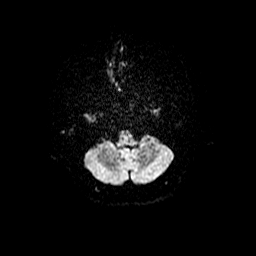
[im 33/98]
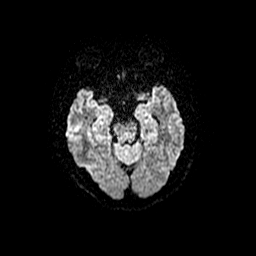
[im 49/98]
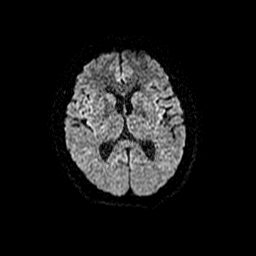
[im 65/98]
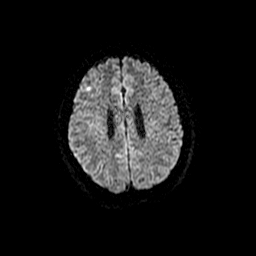
[im 81/98]
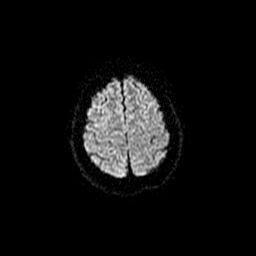
[im 98/98]
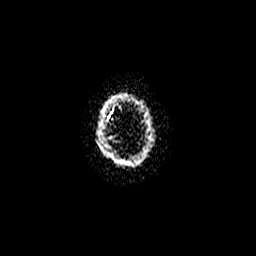

[Series 4: (id) mt fs · axial · 1.4mm · 0.43mm/px · z∈[-58,-16]mm · 4 of 136 slices shown]
[im 1/136]
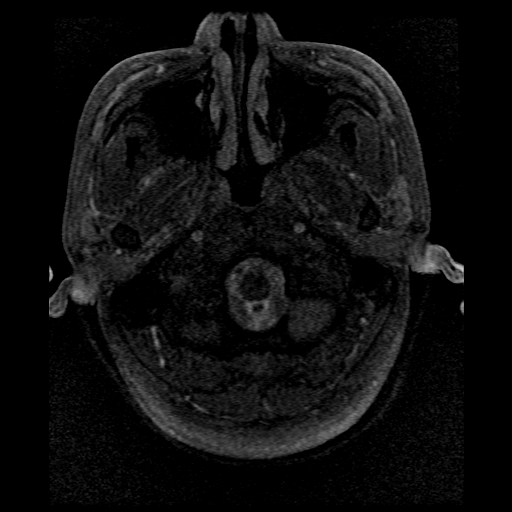
[im 16/136]
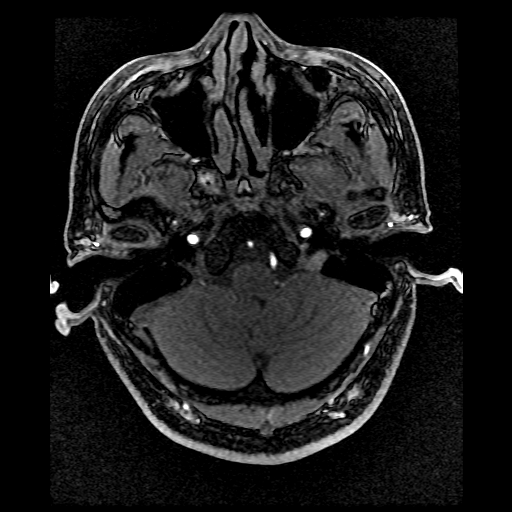
[im 46/136]
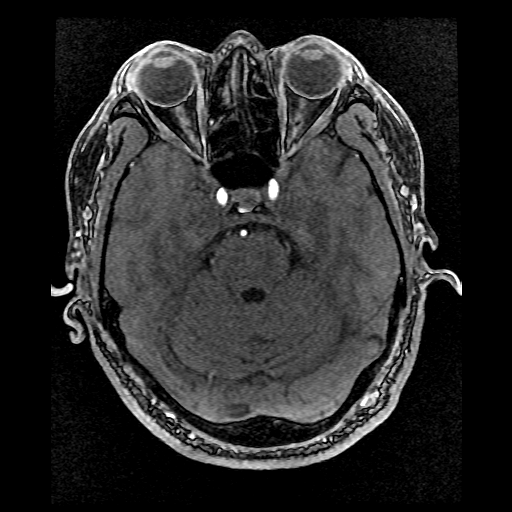
[im 61/136]
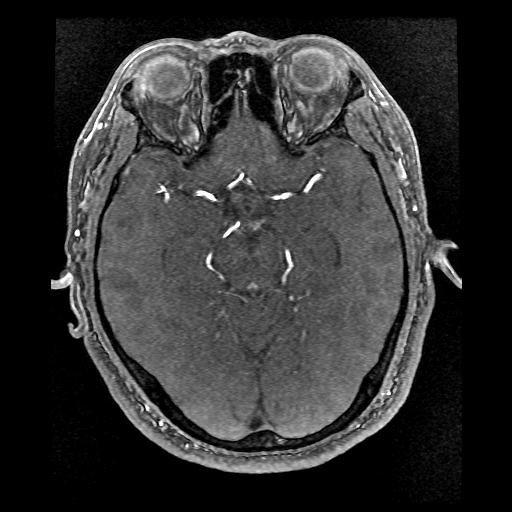

[Series 5: DWI · coronal · 5.0mm · 1.09mm/px · 6 of 72 slices shown (2 of 4)]
[im 1/72]
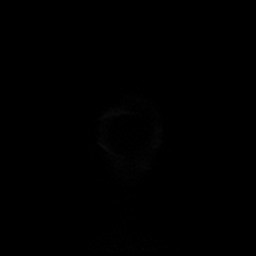
[im 15/72]
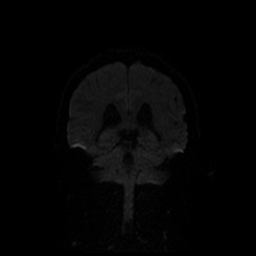
[im 29/72]
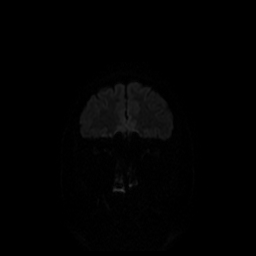
[im 43/72]
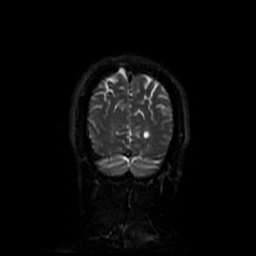
[im 57/72]
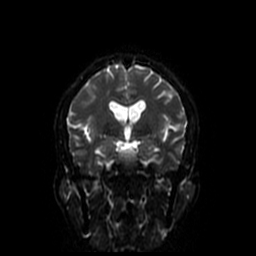
[im 72/72]
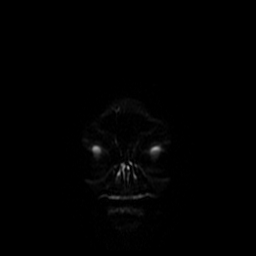

[Series 6: FLAIR · axial · 5.0mm · 0.43mm/px · z∈[-68,+76]mm · 2 of 25 slices shown]
[im 1/25]
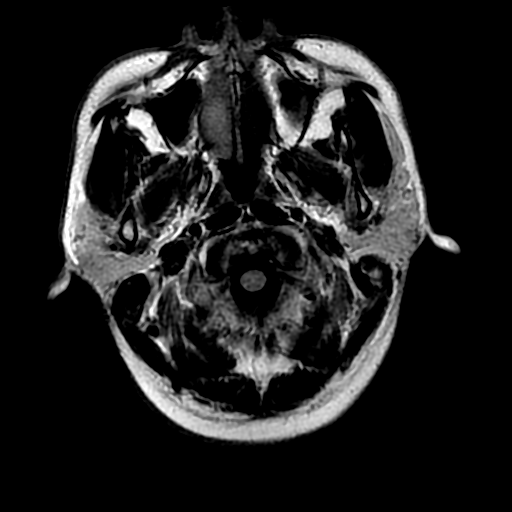
[im 25/25]
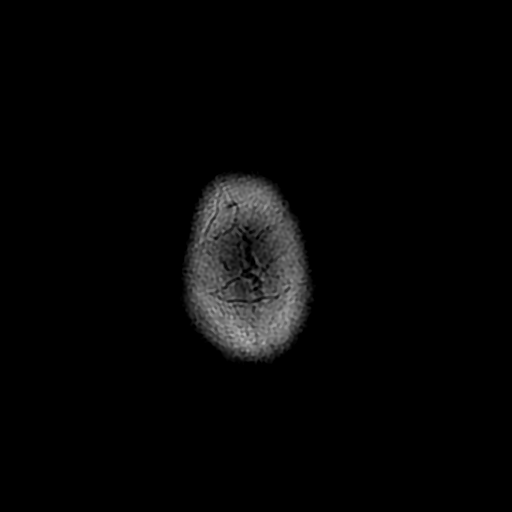

[Series 7: T1 · sagittal · 5.0mm · 0.47mm/px · 2 of 23 slices shown]
[im 1/23]
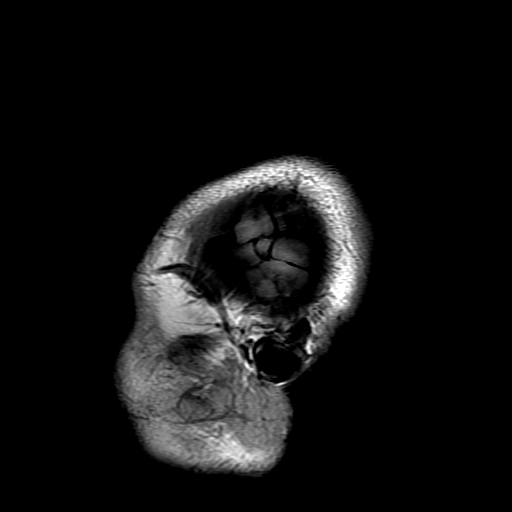
[im 23/23]
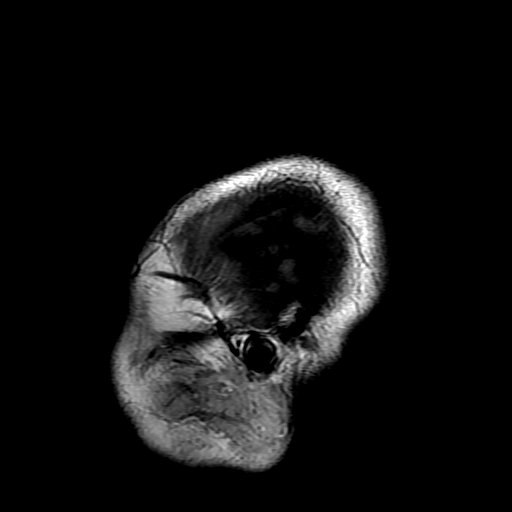

[Series 10: T2 · axial · 5.0mm · 0.43mm/px · z∈[-68,+76]mm · 2 of 25 slices shown (1 of 2)]
[im 1/25]
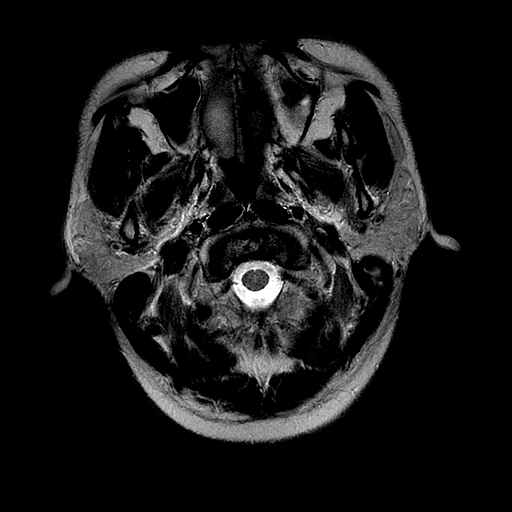
[im 25/25]
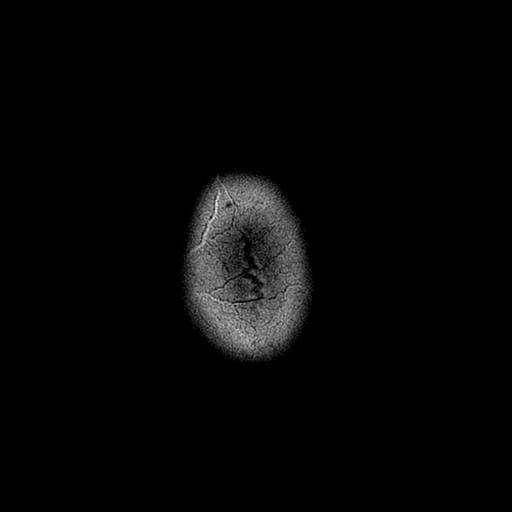

[Series 11: T2 · coronal · 5.0mm · 0.43mm/px · 2 of 30 slices shown (2 of 2)]
[im 1/30]
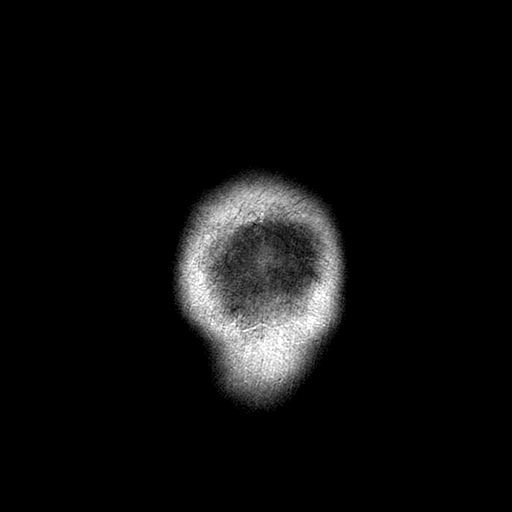
[im 30/30]
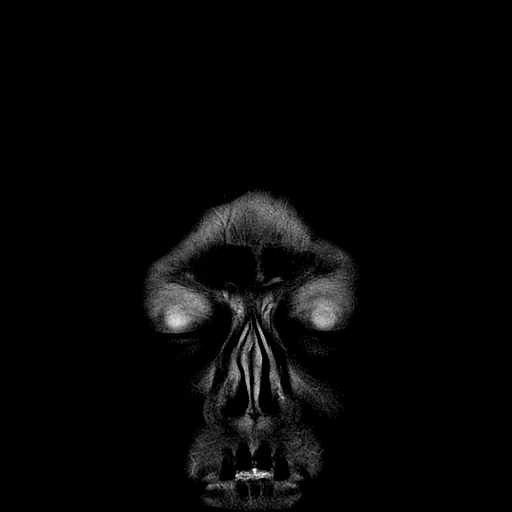

[Series 300: DWI · axial · 3.0mm · 1.09mm/px · z∈[-68,+76]mm · 4 of 49 slices shown (3 of 4)]
[im 1/49]
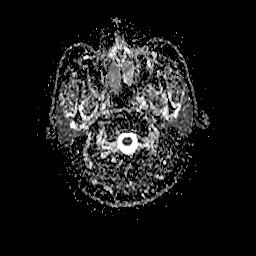
[im 17/49]
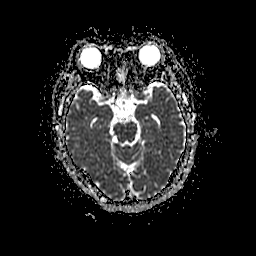
[im 33/49]
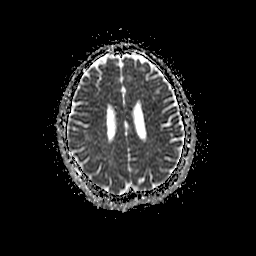
[im 49/49]
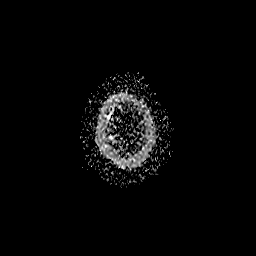

[Series 500: DWI · coronal · 5.0mm · 1.09mm/px · 3 of 36 slices shown (4 of 4)]
[im 1/36]
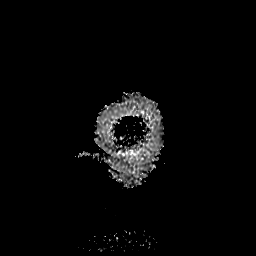
[im 18/36]
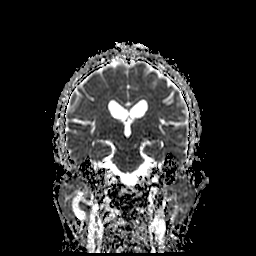
[im 36/36]
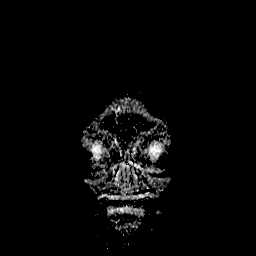

[32 of 48 positions shown; findings below may reference images not displayed]

FINDINGS: MRI HEAD FINDINGS

There is a 1 cm acute infarction affecting the right frontal cortex
near the vertex. No other acute infarction.

The brainstem and cerebellum are normal. There are mild chronic
small-vessel ischemic changes affecting the cerebral hemispheric
white matter. No large vessel territory infarction. No mass lesion,
hemorrhage, hydrocephalus or extra-axial collection. No pituitary
mass. No significant sinus disease.

MRA HEAD FINDINGS

Both internal carotid arteries are widely patent through the siphon
regions. The anterior and middle cerebral vessels on the left are
normal. No stenosis or occlusion. On the right, the anterior
cerebral artery is normal. There is severe stenosis at the right MCA
trifurcation which could be due to plaque stenosis or an embolus.
More distal vessels do show flow.

Both vertebral arteries are widely patent to the basilar. No basilar
stenosis. Posterior circulation branch vessels are normal. Right PCA
takes a fetal origin from the anterior circulation.
IMPRESSION: 1 cm acute infarction in the right frontal cortex near the vertex.

Mild chronic small-vessel change elsewhere within the cerebral
hemispheric white matter.

Severe stenosis at the right MCA trifurcation which could be due to
a plaque or an embolus. More distal vessels do largely show
reconstituted flow.

## 2016-03-24 LAB — CUP PACEART REMOTE DEVICE CHECK
MDC IDC PG IMPLANT DT: 20160830
MDC IDC SESS DTM: 20180221230718

## 2016-04-03 ENCOUNTER — Ambulatory Visit (INDEPENDENT_AMBULATORY_CARE_PROVIDER_SITE_OTHER): Payer: BC Managed Care – PPO | Admitting: *Deleted

## 2016-04-03 DIAGNOSIS — I63411 Cerebral infarction due to embolism of right middle cerebral artery: Secondary | ICD-10-CM | POA: Diagnosis not present

## 2016-04-07 NOTE — Progress Notes (Signed)
Carelink Summary Report / Loop Recorder 

## 2016-04-14 LAB — CUP PACEART REMOTE DEVICE CHECK
Implantable Pulse Generator Implant Date: 20160830
MDC IDC SESS DTM: 20180323234213

## 2016-05-04 ENCOUNTER — Ambulatory Visit (INDEPENDENT_AMBULATORY_CARE_PROVIDER_SITE_OTHER): Payer: BC Managed Care – PPO | Admitting: *Deleted

## 2016-05-04 DIAGNOSIS — I63411 Cerebral infarction due to embolism of right middle cerebral artery: Secondary | ICD-10-CM | POA: Diagnosis not present

## 2016-05-04 NOTE — Progress Notes (Signed)
Carelink Summary Report / Loop Recorder 

## 2016-05-21 LAB — CUP PACEART REMOTE DEVICE CHECK
Implantable Pulse Generator Implant Date: 20160830
MDC IDC SESS DTM: 20180422233801

## 2016-06-02 ENCOUNTER — Ambulatory Visit (INDEPENDENT_AMBULATORY_CARE_PROVIDER_SITE_OTHER): Payer: BC Managed Care – PPO | Admitting: *Deleted

## 2016-06-02 DIAGNOSIS — I63411 Cerebral infarction due to embolism of right middle cerebral artery: Secondary | ICD-10-CM

## 2016-06-03 NOTE — Progress Notes (Signed)
Carelink Summary Report / Loop Recorder 

## 2016-06-05 LAB — CUP PACEART REMOTE DEVICE CHECK
Date Time Interrogation Session: 20180522234411
MDC IDC PG IMPLANT DT: 20160830

## 2016-07-02 ENCOUNTER — Ambulatory Visit (INDEPENDENT_AMBULATORY_CARE_PROVIDER_SITE_OTHER): Payer: BC Managed Care – PPO | Admitting: *Deleted

## 2016-07-02 DIAGNOSIS — I63411 Cerebral infarction due to embolism of right middle cerebral artery: Secondary | ICD-10-CM | POA: Diagnosis not present

## 2016-07-03 NOTE — Progress Notes (Signed)
Carelink Summary Report / Loop Recorder 

## 2016-07-09 LAB — CUP PACEART REMOTE DEVICE CHECK
Implantable Pulse Generator Implant Date: 20160830
MDC IDC SESS DTM: 20180621234321

## 2016-07-09 NOTE — Progress Notes (Signed)
Carelink summary report received. Battery status OK. Normal device function. No new symptom episodes, tachy episodes, brady, or pause episodes. No new AF episodes. Monthly summary reports and ROV/PRN 

## 2016-07-29 ENCOUNTER — Telehealth: Payer: Self-pay | Admitting: Cardiology

## 2016-07-29 NOTE — Telephone Encounter (Signed)
Spoke w/ pt and requested that he send a manual transmission b/c his home monitor has not updated in at least 14 days.   

## 2016-08-03 ENCOUNTER — Ambulatory Visit (INDEPENDENT_AMBULATORY_CARE_PROVIDER_SITE_OTHER): Payer: BC Managed Care – PPO | Admitting: *Deleted

## 2016-08-03 DIAGNOSIS — I63411 Cerebral infarction due to embolism of right middle cerebral artery: Secondary | ICD-10-CM | POA: Diagnosis not present

## 2016-08-04 NOTE — Progress Notes (Signed)
Carelink Summary Report / Loop Recorder 

## 2016-08-16 LAB — CUP PACEART REMOTE DEVICE CHECK
Date Time Interrogation Session: 20180722000918
MDC IDC PG IMPLANT DT: 20160830

## 2016-08-16 NOTE — Progress Notes (Signed)
Carelink summary report received. Battery status OK. Normal device function. No new symptom episodes, tachy episodes, brady, or pause episodes. No new AF episodes. Monthly summary reports and ROV/PRN 

## 2016-08-31 ENCOUNTER — Ambulatory Visit (INDEPENDENT_AMBULATORY_CARE_PROVIDER_SITE_OTHER): Payer: BC Managed Care – PPO | Admitting: *Deleted

## 2016-08-31 DIAGNOSIS — I63411 Cerebral infarction due to embolism of right middle cerebral artery: Secondary | ICD-10-CM | POA: Diagnosis not present

## 2016-09-01 NOTE — Progress Notes (Signed)
Carelink Summary Report / Loop Recorder 

## 2016-09-05 LAB — CUP PACEART REMOTE DEVICE CHECK
Date Time Interrogation Session: 20180821003818
MDC IDC PG IMPLANT DT: 20160830

## 2016-09-05 NOTE — Progress Notes (Signed)
Carelink summary report received. Battery status OK. Normal device function. No new symptom episodes, tachy episodes, brady, or pause episodes. No new AF episodes. Monthly summary reports and ROV/PRN 

## 2016-09-30 ENCOUNTER — Ambulatory Visit (INDEPENDENT_AMBULATORY_CARE_PROVIDER_SITE_OTHER): Payer: BC Managed Care – PPO | Admitting: *Deleted

## 2016-09-30 DIAGNOSIS — I63411 Cerebral infarction due to embolism of right middle cerebral artery: Secondary | ICD-10-CM

## 2016-10-01 LAB — CUP PACEART REMOTE DEVICE CHECK
Implantable Pulse Generator Implant Date: 20160830
MDC IDC SESS DTM: 20180920004234

## 2016-10-01 NOTE — Progress Notes (Signed)
Carelink Summary Report / Loop Recorder 

## 2016-10-14 ENCOUNTER — Telehealth: Payer: Self-pay | Admitting: Cardiology

## 2016-10-14 NOTE — Telephone Encounter (Signed)
LMOVM requesting that pt send manual transmission b/c home monitor has not updated in at least 14 days.    

## 2016-10-29 ENCOUNTER — Ambulatory Visit: Payer: BC Managed Care – PPO | Admitting: *Deleted

## 2016-10-30 NOTE — Progress Notes (Signed)
Not received  

## 2016-11-02 ENCOUNTER — Ambulatory Visit (INDEPENDENT_AMBULATORY_CARE_PROVIDER_SITE_OTHER): Payer: BC Managed Care – PPO | Admitting: *Deleted

## 2016-11-02 DIAGNOSIS — I63411 Cerebral infarction due to embolism of right middle cerebral artery: Secondary | ICD-10-CM

## 2016-11-02 NOTE — Progress Notes (Signed)
Carelink Summary Report / Loop Recorder 

## 2016-11-03 LAB — CUP PACEART REMOTE DEVICE CHECK
Implantable Pulse Generator Implant Date: 20160830
MDC IDC SESS DTM: 20181020004224

## 2016-11-18 ENCOUNTER — Telehealth: Payer: Self-pay | Admitting: Cardiology

## 2016-11-18 NOTE — Telephone Encounter (Signed)
Spoke w/ pt and requested that he send a manual transmission b/c his home monitor has not updated in at least 14 days.   

## 2016-11-30 ENCOUNTER — Ambulatory Visit (INDEPENDENT_AMBULATORY_CARE_PROVIDER_SITE_OTHER): Payer: BC Managed Care – PPO | Admitting: *Deleted

## 2016-11-30 DIAGNOSIS — I63411 Cerebral infarction due to embolism of right middle cerebral artery: Secondary | ICD-10-CM | POA: Diagnosis not present

## 2016-11-30 NOTE — Progress Notes (Signed)
Carelink Summary Report / Loop Recorder 

## 2016-12-17 LAB — CUP PACEART REMOTE DEVICE CHECK
Date Time Interrogation Session: 20181119011125
Implantable Pulse Generator Implant Date: 20160830

## 2016-12-29 ENCOUNTER — Ambulatory Visit (INDEPENDENT_AMBULATORY_CARE_PROVIDER_SITE_OTHER): Payer: BC Managed Care – PPO | Admitting: *Deleted

## 2016-12-29 DIAGNOSIS — I63411 Cerebral infarction due to embolism of right middle cerebral artery: Secondary | ICD-10-CM | POA: Diagnosis not present

## 2016-12-30 NOTE — Progress Notes (Signed)
Carelink Summary Report / Loop Recorder 

## 2017-01-18 LAB — CUP PACEART REMOTE DEVICE CHECK
Implantable Pulse Generator Implant Date: 20160830
MDC IDC SESS DTM: 20181219013947

## 2017-01-28 ENCOUNTER — Ambulatory Visit (INDEPENDENT_AMBULATORY_CARE_PROVIDER_SITE_OTHER): Payer: BC Managed Care – PPO | Admitting: *Deleted

## 2017-01-28 DIAGNOSIS — I63411 Cerebral infarction due to embolism of right middle cerebral artery: Secondary | ICD-10-CM | POA: Diagnosis not present

## 2017-01-29 NOTE — Progress Notes (Signed)
Carelink Summary Report / Loop Recorder 

## 2017-02-02 LAB — CUP PACEART REMOTE DEVICE CHECK
Date Time Interrogation Session: 20190118023831
MDC IDC PG IMPLANT DT: 20160830

## 2017-02-16 ENCOUNTER — Telehealth: Payer: Self-pay | Admitting: Cardiology

## 2017-02-16 NOTE — Telephone Encounter (Signed)
Spoke w/ pt and requested that he send a manual transmission b/c his home monitor has not updated in at least 14 days.   

## 2017-03-01 ENCOUNTER — Ambulatory Visit (INDEPENDENT_AMBULATORY_CARE_PROVIDER_SITE_OTHER): Payer: BC Managed Care – PPO | Admitting: *Deleted

## 2017-03-01 DIAGNOSIS — I63411 Cerebral infarction due to embolism of right middle cerebral artery: Secondary | ICD-10-CM

## 2017-03-01 NOTE — Progress Notes (Signed)
Carelink Summary Report / Loop Recorder 

## 2017-03-29 LAB — CUP PACEART REMOTE DEVICE CHECK
Implantable Pulse Generator Implant Date: 20160830
MDC IDC SESS DTM: 20190218104143

## 2017-04-05 ENCOUNTER — Ambulatory Visit (INDEPENDENT_AMBULATORY_CARE_PROVIDER_SITE_OTHER): Payer: BC Managed Care – PPO | Admitting: *Deleted

## 2017-04-05 DIAGNOSIS — I63411 Cerebral infarction due to embolism of right middle cerebral artery: Secondary | ICD-10-CM | POA: Diagnosis not present

## 2017-04-05 NOTE — Progress Notes (Signed)
Carelink Summary Report / Loop Recorder 

## 2017-04-28 ENCOUNTER — Telehealth: Payer: Self-pay | Admitting: Cardiology

## 2017-04-28 NOTE — Telephone Encounter (Signed)
LMOVM requesting that pt send manual transmission b/c home monitor has not updated in at least 14 days.    

## 2017-05-04 ENCOUNTER — Encounter: Payer: Self-pay | Admitting: Cardiology

## 2017-05-06 ENCOUNTER — Ambulatory Visit (INDEPENDENT_AMBULATORY_CARE_PROVIDER_SITE_OTHER): Payer: BC Managed Care – PPO | Admitting: *Deleted

## 2017-05-06 DIAGNOSIS — I63411 Cerebral infarction due to embolism of right middle cerebral artery: Secondary | ICD-10-CM

## 2017-05-07 NOTE — Progress Notes (Signed)
Carelink Summary Report / Loop Recorder 

## 2017-05-12 LAB — CUP PACEART REMOTE DEVICE CHECK
Date Time Interrogation Session: 20190323103518
Implantable Pulse Generator Implant Date: 20160830

## 2017-06-01 LAB — CUP PACEART REMOTE DEVICE CHECK
MDC IDC PG IMPLANT DT: 20160830
MDC IDC SESS DTM: 20190425113720

## 2017-06-08 ENCOUNTER — Ambulatory Visit (INDEPENDENT_AMBULATORY_CARE_PROVIDER_SITE_OTHER): Payer: BC Managed Care – PPO | Admitting: *Deleted

## 2017-06-08 DIAGNOSIS — I63411 Cerebral infarction due to embolism of right middle cerebral artery: Secondary | ICD-10-CM | POA: Diagnosis not present

## 2017-06-08 NOTE — Progress Notes (Signed)
Carelink Summary Report / Loop Recorder 

## 2017-06-30 LAB — CUP PACEART REMOTE DEVICE CHECK
Date Time Interrogation Session: 20190528113633
MDC IDC PG IMPLANT DT: 20160830

## 2017-07-12 ENCOUNTER — Ambulatory Visit (INDEPENDENT_AMBULATORY_CARE_PROVIDER_SITE_OTHER): Payer: BC Managed Care – PPO | Admitting: *Deleted

## 2017-07-12 DIAGNOSIS — I63411 Cerebral infarction due to embolism of right middle cerebral artery: Secondary | ICD-10-CM | POA: Diagnosis not present

## 2017-07-13 NOTE — Progress Notes (Signed)
Carelink Summary Report / Loop Recorder 

## 2017-08-04 ENCOUNTER — Telehealth: Payer: Self-pay | Admitting: Cardiology

## 2017-08-04 NOTE — Telephone Encounter (Signed)
Attempted to call pt b/c his home monitor has not updated in at least 14 days. No answer and unable to leave a message.  

## 2017-08-10 LAB — CUP PACEART REMOTE DEVICE CHECK
Date Time Interrogation Session: 20190630120806
MDC IDC PG IMPLANT DT: 20160830

## 2017-08-13 ENCOUNTER — Ambulatory Visit (INDEPENDENT_AMBULATORY_CARE_PROVIDER_SITE_OTHER): Payer: BC Managed Care – PPO | Admitting: *Deleted

## 2017-08-13 DIAGNOSIS — I639 Cerebral infarction, unspecified: Secondary | ICD-10-CM | POA: Diagnosis not present

## 2017-08-13 NOTE — Progress Notes (Signed)
Carelink Summary Report / Loop Recorder 

## 2017-09-15 ENCOUNTER — Ambulatory Visit (INDEPENDENT_AMBULATORY_CARE_PROVIDER_SITE_OTHER): Payer: BC Managed Care – PPO | Admitting: *Deleted

## 2017-09-15 DIAGNOSIS — I639 Cerebral infarction, unspecified: Secondary | ICD-10-CM | POA: Diagnosis not present

## 2017-09-15 NOTE — Progress Notes (Signed)
Carelink Summary Report / Loop Recorder 

## 2017-09-23 LAB — CUP PACEART REMOTE DEVICE CHECK
Date Time Interrogation Session: 20190802121039
Implantable Pulse Generator Implant Date: 20160830

## 2017-10-05 LAB — CUP PACEART REMOTE DEVICE CHECK
Date Time Interrogation Session: 20190904124034
Implantable Pulse Generator Implant Date: 20160830

## 2017-10-18 ENCOUNTER — Ambulatory Visit (INDEPENDENT_AMBULATORY_CARE_PROVIDER_SITE_OTHER): Payer: BC Managed Care – PPO | Admitting: *Deleted

## 2017-10-18 DIAGNOSIS — I639 Cerebral infarction, unspecified: Secondary | ICD-10-CM | POA: Diagnosis not present

## 2017-10-19 NOTE — Progress Notes (Signed)
Carelink Summary Report / Loop Recorder 

## 2017-11-01 LAB — CUP PACEART REMOTE DEVICE CHECK
Date Time Interrogation Session: 20191007144043
MDC IDC PG IMPLANT DT: 20160830

## 2017-11-22 ENCOUNTER — Ambulatory Visit (INDEPENDENT_AMBULATORY_CARE_PROVIDER_SITE_OTHER): Payer: BC Managed Care – PPO | Admitting: *Deleted

## 2017-11-22 DIAGNOSIS — I639 Cerebral infarction, unspecified: Secondary | ICD-10-CM

## 2017-11-22 NOTE — Progress Notes (Signed)
Carelink Summary Report / Loop Recorder 

## 2017-12-23 ENCOUNTER — Ambulatory Visit (INDEPENDENT_AMBULATORY_CARE_PROVIDER_SITE_OTHER): Payer: BC Managed Care – PPO

## 2017-12-23 DIAGNOSIS — I639 Cerebral infarction, unspecified: Secondary | ICD-10-CM | POA: Diagnosis not present

## 2017-12-24 NOTE — Progress Notes (Signed)
Carelink Summary Report / Loop Recorder 

## 2018-01-08 LAB — CUP PACEART REMOTE DEVICE CHECK
Date Time Interrogation Session: 20191109204009
MDC IDC PG IMPLANT DT: 20160830

## 2018-01-25 ENCOUNTER — Ambulatory Visit (INDEPENDENT_AMBULATORY_CARE_PROVIDER_SITE_OTHER): Payer: BC Managed Care – PPO

## 2018-01-25 DIAGNOSIS — I639 Cerebral infarction, unspecified: Secondary | ICD-10-CM | POA: Diagnosis not present

## 2018-01-26 NOTE — Progress Notes (Signed)
Carelink Summary Report / Loop Recorder 

## 2018-01-28 LAB — CUP PACEART REMOTE DEVICE CHECK
Implantable Pulse Generator Implant Date: 20160830
MDC IDC SESS DTM: 20200114214131

## 2018-02-03 LAB — CUP PACEART REMOTE DEVICE CHECK
Implantable Pulse Generator Implant Date: 20160830
MDC IDC SESS DTM: 20191212213935

## 2018-02-28 ENCOUNTER — Ambulatory Visit (INDEPENDENT_AMBULATORY_CARE_PROVIDER_SITE_OTHER): Payer: BC Managed Care – PPO

## 2018-02-28 DIAGNOSIS — I639 Cerebral infarction, unspecified: Secondary | ICD-10-CM

## 2018-02-28 LAB — CUP PACEART REMOTE DEVICE CHECK
Implantable Pulse Generator Implant Date: 20160830
MDC IDC SESS DTM: 20200216224029

## 2018-03-09 ENCOUNTER — Telehealth: Payer: Self-pay | Admitting: Cardiology

## 2018-03-09 NOTE — Telephone Encounter (Signed)
Patient called back and I informed pt of his ILR reaching RRT. Informed pt of his options. Pt stated that he wants to have the ILR removed. Informed pt to expect a phone call from a scheduler to schedule an appt to discuss the procedure and to expect a return kit in the mail. Pt verbalized understanding.

## 2018-03-09 NOTE — Progress Notes (Signed)
Carelink Summary Report / Loop Recorder 

## 2018-03-09 NOTE — Telephone Encounter (Signed)
LMOVM for pt to return call. ILR at RRT.  

## 2018-04-01 ENCOUNTER — Encounter: Payer: Self-pay | Admitting: Internal Medicine

## 2018-04-11 ENCOUNTER — Encounter: Payer: BC Managed Care – PPO | Admitting: Internal Medicine

## 2018-04-12 ENCOUNTER — Other Ambulatory Visit: Payer: Self-pay | Admitting: Internal Medicine

## 2018-08-29 ENCOUNTER — Telehealth: Payer: Self-pay

## 2018-08-29 NOTE — Telephone Encounter (Signed)
Spoke with pt regarding covid-19 screening. Pt stated he has not been in contact with anyone who may have covid-19 and has no symptoms.

## 2018-08-31 ENCOUNTER — Ambulatory Visit: Payer: BC Managed Care – PPO | Admitting: Internal Medicine

## 2018-08-31 ENCOUNTER — Other Ambulatory Visit: Payer: Self-pay

## 2018-08-31 ENCOUNTER — Encounter: Payer: Self-pay | Admitting: Internal Medicine

## 2018-08-31 VITALS — BP 126/90 | HR 66 | Ht 59.0 in | Wt 123.0 lb

## 2018-08-31 DIAGNOSIS — I63411 Cerebral infarction due to embolism of right middle cerebral artery: Secondary | ICD-10-CM | POA: Diagnosis not present

## 2018-08-31 HISTORY — PX: OTHER SURGICAL HISTORY: SHX169

## 2018-08-31 LAB — CUP PACEART INCLINIC DEVICE CHECK
Date Time Interrogation Session: 20200819101549
Implantable Pulse Generator Implant Date: 20160830

## 2018-08-31 NOTE — Progress Notes (Signed)
   PCP: Donald Prose, MD   Primary EP: Dr Gaspar Bidding is a 53 y.o. male who presents today for routine electrophysiology followup.  Since having his ILR implanted, the patient reports doing very well.  Completely recovered from his stroke and doing well.  His ILR is at EOL.  He presents today to discuss removal.  Today, he denies symptoms of palpitations, chest pain, shortness of breath,  lower extremity edema, dizziness, presyncope, or syncope.  The patient is otherwise without complaint today.   Past Medical History:  Diagnosis Date  . Hyperlipidemia   . Stroke [facility])    Past Surgical History:  Procedure Laterality Date  . EP IMPLANTABLE DEVICE N/A 09/11/2014   Procedure: Loop Recorder Insertion;  Surgeon: Thompson Grayer, MD;  Location: [place] CV LAB;  Service: Cardiovascular;  Laterality: N/A;  . LOOP RECORDER IMPLANT    . NO PAST SURGERIES    . TEE WITHOUT CARDIOVERSION N/A 09/11/2014   Procedure: TRANSESOPHAGEAL ECHOCARDIOGRAM (TEE);  Surgeon: Dorothy Spark, MD;  Location: [facility] ENDOSCOPY;  Service: Cardiovascular;  Laterality: N/A;    ROS- all systems are reviewed and negatives except as per HPI above  Current Outpatient Medications  Medication Sig Dispense Refill  . atorvastatin (LIPITOR) 20 MG tablet TAKE 1 TABLET (20 MG TOTAL) BY MOUTH DAILY AT 6 PM. 90 tablet 0  . clopidogrel (PLAVIX) 75 MG tablet TAKE 1 TABLET BY MOUTH EVERY DAY 90 tablet 0   No current facility-administered medications for this visit.     Physical Exam: Vitals:   08/31/18 1008  BP: 126/90  Pulse: 66  SpO2: 98%  Weight: 123 lb (55.8 kg)  Height: 4\' 11"  (1.499 m)    GEN- The patient is well appearing, alert and oriented x 3 today.   Head- normocephalic, atraumatic Eyes-  Sclera clear, conjunctiva pink Ears- hearing intact Oropharynx- clear Lungs- Clear to ausculation bilaterally, normal work of breathing Heart- Regular rate and rhythm, no murmurs, rubs or gallops, PMI not laterally  displaced GI- soft, NT, ND, + BS Extremities- no clubbing, cyanosis, or edema  Wt Readings from Last 3 Encounters:  08/31/18 123 lb (55.8 kg)  05/16/15 126 lb (57.2 kg)  11/12/14 127 lb 9.6 oz (57.9 kg)    Assessment and Plan:  1. Cryptogenic stroke Presents today to discuss ILR which is at [place].  We discussed at length today.  He did not have afib detected in over 3 years of monitoring. Risks and benefits to ILR removal were discussed at length with the patient who wishes to proceed.  Thompson Grayer MD, [facility] 08/31/2018    DESCRIPTION OF PROCEDURE:  Informed written consent was obtained.  The patient required no sedation for the procedure today.   The patients left chest was therefore prepped and draped in the usual sterile fashion.  The skin overlying the ILR monitor was infiltrated with lidocaine for local analgesia.  A 0.5-cm incision was made over the site.  The previously implanted ILR was exposed and removed using a combination of sharp and blunt dissection.  Steri- Strips and a sterile dressing were then applied. EBL<52ml.  There were no early apparent complications.     CONCLUSIONS:   1. Successful explantation of a [employer] Reveal LINQ implantable loop recorder   2. No early apparent complications.        Thompson Grayer MD, [facility] 08/31/2018 2:08 PM

## 2018-08-31 NOTE — Patient Instructions (Signed)
Medication Instructions:  Your physician recommends that you continue on your current medications as directed. Please refer to the Current Medication list given to you today.  Labwork: None ordered.  Testing/Procedures: None ordered.  Follow-Up:  Your physician wants you to follow-up in: as needed with Dr. Allred.     Implantable Loop Recorder Placement, Care After Refer to this sheet in the next few weeks. These instructions provide you with information about caring for yourself after your procedure. Your health care provider may also give you more specific instructions. Your treatment has been planned according to current medical practices, but problems sometimes occur. Call your health care provider if you have any problems or questions after your procedure. What can I expect after the procedure? After the procedure, it is common to have:  Soreness or pain near the cut from surgery (incision).  Some swelling or bruising near the incision.  Follow these instructions at home:  Medicines  Take over-the-counter and prescription medicines only as told by your health care provider.  If you were prescribed an antibiotic medicine, take it as told by your health care provider. Do not stop taking the antibiotic even if you start to feel better.  Bathing Do not take baths, swim, or use a hot tub until your health care provider approves. You may shower 24 hours after removal of your monitor.  Incision care  Follow instructions from your health care provider about how to take care of your incision. Make sure you: ? Remove your top dressing after 24 hours (before you shower) ? Leave stitches (sutures), skin glue, or adhesive strips in place. These skin closures may need to stay in place for 2 weeks or longer. If adhesive strip edges start to loosen and curl up, you may trim the loose edges. Do not remove adhesive strips completely unless your health care provider tells you to do  that.  Check your incision area every day for signs of infection. Check for: ? More redness, swelling, or pain. ? Fluid or blood. ? Warmth. ? Pus or a bad smell.  Contact a health care provider if:  You have more redness, swelling, or pain around your incision.  You have more fluid or blood coming from your incision.  Your incision feels warm to the touch.  You have pus or a bad smell coming from your incision.  You have a fever.  You have pain that is not relieved by your pain medicine.  You have triggered your device because of fainting (syncope) or because of a heartbeat that feels like it is racing, slow, fluttering, or skipping (palpitations).  

## 2019-03-23 ENCOUNTER — Ambulatory Visit: Payer: BC Managed Care – PPO | Attending: Family

## 2019-03-23 DIAGNOSIS — Z23 Encounter for immunization: Secondary | ICD-10-CM

## 2019-03-23 NOTE — Progress Notes (Signed)
   Covid-[facility] Vaccination Clinic  Name:  Ryan Mitchell    MRN: [id number] DOB: 11/20/1965  03/23/2019  [name] was observed post Covid-19 immunization for 15 minutes without incident. He was provided with Vaccine Information Sheet and instruction to access the V-Safe system.   [name] was instructed to call 911 with any severe reactions post vaccine: Marland Kitchen Difficulty breathing  . Swelling of face and throat  . A fast heartbeat  . A bad rash all over body  . Dizziness and weakness   Immunizations Administered    Name Date Dose VIS Date Route   Moderna COVID-19 Vaccine 03/23/2019 11:22 AM 0.5 mL 12/13/2018 Intramuscular   Manufacturer: Moderna   Lot: [id number]   NDC: 00762-263-33

## 2019-04-25 ENCOUNTER — Ambulatory Visit: Payer: BC Managed Care – PPO | Attending: Family

## 2019-04-25 DIAGNOSIS — Z23 Encounter for immunization: Secondary | ICD-10-CM

## 2019-04-25 NOTE — Progress Notes (Signed)
   Covid-19 Vaccination Clinic  Name:  Amore Grater    MRN: [id number] DOB: [birth year]  04/25/2019  Mr. Enrico was observed post Covid-19 immunization for 15 minutes without incident. He was provided with Vaccine Information Sheet and instruction to access the V-Safe system.   Mr. Lozito was instructed to call 911 with any severe reactions post vaccine: Marland Kitchen Difficulty breathing  . Swelling of face and throat  . A fast heartbeat  . A bad rash all over body  . Dizziness and weakness   Immunizations Administered    Name Date Dose VIS Date Route   Moderna COVID-19 Vaccine 04/25/2019  2:12 PM 0.5 mL 12/13/2018 Intramuscular   Manufacturer: Moderna   Lot: [id number]   NDC: 67703-403-52

## 2019-11-20 ENCOUNTER — Ambulatory Visit: Payer: BC Managed Care – PPO | Attending: Internal Medicine

## 2019-11-20 DIAGNOSIS — Z23 Encounter for immunization: Secondary | ICD-10-CM

## 2019-11-20 NOTE — Progress Notes (Signed)
   Covid-19 Vaccination Clinic  Name:  Ryan Mitchell    MRN: [id number] DOB: 1965/03/09  11/20/2019  Mr. Ryan Mitchell was observed post Covid-19 immunization for 15 minutes without incident. He was provided with Vaccine Information Sheet and instruction to access the V-Safe system.   Mr. Ryan Mitchell was instructed to call 911 with any severe reactions post vaccine: Marland Kitchen Difficulty breathing  . Swelling of face and throat  . A fast heartbeat  . A bad rash all over body  . Dizziness and weakness

## 2020-05-06 ENCOUNTER — Ambulatory Visit: Payer: BC Managed Care – PPO | Attending: Family

## 2020-05-06 DIAGNOSIS — Z23 Encounter for immunization: Secondary | ICD-10-CM

## 2020-06-08 NOTE — Progress Notes (Signed)
   Covid-19 Vaccination Clinic  Name:  Ryan Mitchell    MRN: [id number] DOB: 1965-07-30  06/08/2020  [name] was observed post Covid-19 immunization for 15 minutes without incident. He was provided with Vaccine Information Sheet and instruction to access the V-Safe system.   [name] was instructed to call 911 with any severe reactions post vaccine: Marland Kitchen Difficulty breathing  . Swelling of face and throat  . A fast heartbeat  . A bad rash all over body  . Dizziness and weakness   Immunizations Administered    Name Date Dose VIS Date Route   Moderna Covid-19 Booster Vaccine 05/06/2020 10:30 AM 0.25 mL 11/01/2019 Intramuscular   Manufacturer: Moderna   Lot: [id number]   NDC: 24268-341-96
